# Patient Record
Sex: Male | Born: 1985 | Race: White | Hispanic: No | Marital: Married | Smoking: Never smoker
Health system: Southern US, Community
[De-identification: ages and names within clinical notes are randomized; demographics above are authoritative.]

## PROBLEM LIST (undated history)

## (undated) DIAGNOSIS — L409 Psoriasis, unspecified: Secondary | ICD-10-CM

## (undated) DIAGNOSIS — L0390 Cellulitis, unspecified: Secondary | ICD-10-CM

## (undated) DIAGNOSIS — D229 Melanocytic nevi, unspecified: Secondary | ICD-10-CM

## (undated) DIAGNOSIS — R12 Heartburn: Secondary | ICD-10-CM

## (undated) HISTORY — DX: Psoriasis, unspecified: L40.9

## (undated) HISTORY — DX: Melanocytic nevi, unspecified: D22.9

## (undated) HISTORY — DX: Heartburn: R12

---
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
## 2012-12-24 ENCOUNTER — Encounter (HOSPITAL_COMMUNITY): Payer: Self-pay | Admitting: Emergency Medicine

## 2012-12-24 ENCOUNTER — Emergency Department (HOSPITAL_COMMUNITY)
Admission: EM | Admit: 2012-12-24 | Discharge: 2012-12-24 | Disposition: A | Discharge status: Home / Self Care | Payer: BC Managed Care – PPO | Attending: Emergency Medicine | Admitting: Emergency Medicine

## 2012-12-24 DIAGNOSIS — Y939 Activity, unspecified: Secondary | ICD-10-CM | POA: Insufficient documentation

## 2012-12-24 DIAGNOSIS — Y929 Unspecified place or not applicable: Secondary | ICD-10-CM | POA: Insufficient documentation

## 2012-12-24 DIAGNOSIS — Z91030 Bee allergy status: Secondary | ICD-10-CM

## 2012-12-24 DIAGNOSIS — T63461A Toxic effect of venom of wasps, accidental (unintentional), initial encounter: Secondary | ICD-10-CM | POA: Insufficient documentation

## 2012-12-24 DIAGNOSIS — Z872 Personal history of diseases of the skin and subcutaneous tissue: Secondary | ICD-10-CM | POA: Insufficient documentation

## 2012-12-24 DIAGNOSIS — T6391XA Toxic effect of contact with unspecified venomous animal, accidental (unintentional), initial encounter: Secondary | ICD-10-CM | POA: Insufficient documentation

## 2012-12-24 HISTORY — DX: Cellulitis, unspecified: L03.90

## 2012-12-24 MED ORDER — PREDNISONE 20 MG PO TABS
60.0000 mg | ORAL_TABLET | Freq: Once | ORAL | Status: AC
  Administered 2012-12-24: 60 mg via ORAL
  Filled 2012-12-24 (×2): qty 3

## 2012-12-24 MED ORDER — PREDNISONE 20 MG PO TABS: 40.0000 mg | ORAL_TABLET | Freq: Every day | ORAL | Status: DC

## 2012-12-24 NOTE — ED Notes (Signed)
Pt states that he was stung almost 48 hrs ago by what he thinks were red wasps. Was having no problems but is now having swelling, redness, and pain in R ankle where he was stung.

## 2012-12-24 NOTE — ED Provider Notes (Signed)
CSN: [id number]     Arrival date & time 12/24/12  2204 History    This chart was scribed for Chad Schmidt, non-physician practitioner working with Chad Anger, DO by Leone Payor, ED Scribe. This patient was seen in room WTR7/WTR7 and the patient's care was started at 2204.   First MD Initiated Contact with Patient 12/24/12 2216     Chief Complaint  Patient presents with  . Insect Bite    The history is provided by the patient. No language interpreter was used.    HPI Comments: Chad Schmidt is a 27 y.o. male who presents to the Emergency Department complaining of a wasp sting to the right ankle that occurred about 48 hours ago. States he has taken benadryl, aspirin, advil and applied ice without relief. He is continuing to have constant, unchanged swelling, redness, and pain. He denies a past medical history. Denies allergies to bees.      Past Medical History  Diagnosis Date  . Cellulitis    No past surgical history on file. No family history on file. History  Substance Use Topics  . Smoking status: Never Smoker   . Smokeless tobacco: Not on file  . Alcohol Use: 4.2 oz/week    7 Cans of beer per week    Review of Systems  Skin:       Wasp sting  All other systems reviewed and are negative.    Allergies  Review of patient's allergies indicates no known allergies.  Home Medications   Current Outpatient Rx  Name  Route  Sig  Dispense  Refill  . aspirin 325 MG tablet   Oral   Take 650 mg by mouth every 6 (six) hours as needed for pain.         . diphenhydrAMINE (BENADRYL) 25 MG tablet   Oral   Take 25 mg by mouth every 6 (six) hours as needed for itching.         Marland Kitchen ibuprofen (ADVIL,MOTRIN) 200 MG tablet   Oral   Take 600 mg by mouth every 6 (six) hours as needed for pain.          BP 128/85  Pulse 80  Temp(Src) 98.4 F (36.9 C) (Oral)  SpO2 99% Physical Exam  Nursing note and vitals reviewed. Constitutional: He is oriented to person,  place, and time. He appears well-developed and well-nourished.  HENT:  Head: Normocephalic and atraumatic.  Eyes: Conjunctivae and EOM are normal. Pupils are equal, round, and reactive to light.  Neck: Normal range of motion. Neck supple.  Cardiovascular: Normal rate, regular rhythm, normal heart sounds and intact distal pulses.  Exam reveals no gallop and no friction rub.   No murmur heard. Intact distal pulses with brisk cap refill.   Pulmonary/Chest: Effort normal and breath sounds normal.  Abdominal: Soft. Bowel sounds are normal.  Musculoskeletal: Normal range of motion.  Right ankle is mildly swollen. Mildly painful with ROM but pt is able to weight bear.   Neurological: He is alert and oriented to person, place, and time.  Skin: Skin is warm and dry.  Mildly inflamed but no evidence of cellulitis or discharge. No abscesses.    Psychiatric: He has a normal mood and affect.    ED Course   Procedures (including critical care time)  DIAGNOSTIC STUDIES: Oxygen Saturation is 99% on RA, normal by my interpretation.    COORDINATION OF CARE: 11:06 PM Discussed treatment plan with pt at bedside and pt agreed to  plan.   Labs Reviewed - No data to display No results found. 1. Bee sting allergy     MDM  Patient with bee sting, approximately 2 days ago, reporting ongoing swelling of right ankle. No evidence of infection. Will discharge home with prednisone. Recommend followup with primary care provider.  I personally performed the services described in this documentation, which was scribed in my presence. The recorded information has been reviewed and is accurate.    Chad Horseman, PA-C 12/25/12 0022

## 2012-12-26 NOTE — ED Provider Notes (Signed)
Medical screening examination/treatment/procedure(s) were performed by non-physician practitioner and as supervising physician I was immediately available for consultation/collaboration.   Laray Anger, DO 12/26/12 1752

## 2015-10-02 DIAGNOSIS — M549 Dorsalgia, unspecified: Secondary | ICD-10-CM | POA: Insufficient documentation

## 2015-10-02 DIAGNOSIS — J45909 Unspecified asthma, uncomplicated: Secondary | ICD-10-CM | POA: Insufficient documentation

## 2015-10-02 DIAGNOSIS — L409 Psoriasis, unspecified: Secondary | ICD-10-CM | POA: Insufficient documentation

## 2015-10-02 DIAGNOSIS — A6000 Herpesviral infection of urogenital system, unspecified: Secondary | ICD-10-CM | POA: Insufficient documentation

## 2015-10-02 DIAGNOSIS — Z8659 Personal history of other mental and behavioral disorders: Secondary | ICD-10-CM | POA: Insufficient documentation

## 2016-01-27 ENCOUNTER — Other Ambulatory Visit: Payer: Self-pay | Admitting: Gastroenterology

## 2016-01-27 DIAGNOSIS — R7989 Other specified abnormal findings of blood chemistry: Secondary | ICD-10-CM

## 2016-01-27 DIAGNOSIS — R945 Abnormal results of liver function studies: Principal | ICD-10-CM

## 2016-01-27 LAB — CHG HEINZ BODIES; DIRECT: ANA Direct: NEGATIVE

## 2016-01-27 LAB — IRON,TIBC AND FERRITIN PANEL
%SAT: 20
Ferritin: 203.6
Iron: 82
TIBC: 405

## 2016-01-27 LAB — HEPATITIS B SURFACE ANTIGEN
HBsAg Screen: NEGATIVE
HCV Ab: <0.1
Hep B Core Ab, Tot: NEGATIVE

## 2016-02-03 LAB — HM COLONOSCOPY

## 2016-02-05 ENCOUNTER — Other Ambulatory Visit: Payer: Self-pay

## 2016-02-12 ENCOUNTER — Ambulatory Visit
Admission: RE | Admit: 2016-02-12 | Discharge: 2016-02-12 | Disposition: A | Discharge status: Home / Self Care | Payer: 59 | Source: Ambulatory Visit | Attending: Gastroenterology | Admitting: Gastroenterology

## 2016-02-12 DIAGNOSIS — R7989 Other specified abnormal findings of blood chemistry: Secondary | ICD-10-CM

## 2016-02-12 DIAGNOSIS — R945 Abnormal results of liver function studies: Principal | ICD-10-CM

## 2016-02-17 ENCOUNTER — Other Ambulatory Visit: Payer: Self-pay | Admitting: Gastroenterology

## 2016-02-17 DIAGNOSIS — K8020 Calculus of gallbladder without cholecystitis without obstruction: Secondary | ICD-10-CM

## 2016-02-21 ENCOUNTER — Ambulatory Visit
Admission: RE | Admit: 2016-02-21 | Discharge: 2016-02-21 | Disposition: A | Discharge status: Home / Self Care | Payer: 59 | Source: Ambulatory Visit | Attending: Gastroenterology | Admitting: Gastroenterology

## 2016-02-21 DIAGNOSIS — K8020 Calculus of gallbladder without cholecystitis without obstruction: Secondary | ICD-10-CM

## 2016-02-21 MED ORDER — GADOBENATE DIMEGLUMINE 529 MG/ML IV SOLN: 17.0000 mL | Freq: Once | INTRAVENOUS | Status: DC | PRN

## 2016-06-03 LAB — BOWEL DISORDERS CASCADE: Tissue Transglutaminase Ab, IgA: <2

## 2016-06-03 LAB — PROTHROMBIN TIME: Prothrombin Time: 10.2

## 2016-06-03 LAB — ENDOMYSIAL IGA ANTIBODY: Endomysial Ab, IgA: NEGATIVE

## 2016-06-03 LAB — TSH: TSH: 1.52 (ref <=5.90)

## 2016-06-03 LAB — IGE: Immunoglobulin E, Total: 123

## 2016-07-14 ENCOUNTER — Telehealth: Payer: 59 | Admitting: Nurse Practitioner

## 2016-07-14 DIAGNOSIS — H6691 Otitis media, unspecified, right ear: Secondary | ICD-10-CM

## 2016-07-14 MED ORDER — CEFDINIR 300 MG PO CAPS: 300.0000 mg | ORAL_CAPSULE | Freq: Two times a day (BID) | ORAL | 0 refills | Status: AC

## 2016-07-14 MED ORDER — FLUTICASONE PROPIONATE 50 MCG/ACT NA SUSP: 2.0000 | Freq: Every day | NASAL | 0 refills | Status: DC

## 2016-07-14 NOTE — Progress Notes (Signed)
We are sorry that you are not feeling well. Here is how we plan to help!   Based on what you have shared with me it looks like you have sinusitis and right otitis media (ear infection) which have impacted your sinuses.  There is not specific treatment for viral sinusitis other than to help you with the symptoms until the infection runs its course. You may use an oral decongestant such as Mucinex D or if you have glaucoma or high blood pressure use plain Mucinex. Saline nasal spray help and can safely be used as often as needed for congestion, I have prescribed: Fluticasone nasal spray two sprays in each nostril twice a day.    For your ear infection, I am prescribing Cefdinir 300mg  twice daily for 10 days. For your ear pain, fever or general discomfort, you may use over-the-counter Ibuprofen or Tylenol as directed.    Sinus infections and ear infections are not as easily transmitted as other respiratory infection, however we still recommend that you avoid close contact with loved ones, especially the very young and elderly. Remember to wash your hands thoroughly throughout the day as this is the number one way to prevent the spread of infection!   Home Care:  Only take medications as instructed by your medical team.  Complete the entire course of an antibiotic.  Do not take these medications with alcohol.  A steam or ultrasonic humidifier can help congestion. You can place a towel over your head and breathe in the steam from hot water coming from a faucet.  Avoid close contacts especially the very young and the elderly.  Cover your mouth when you cough or sneeze.  Always remember to wash your hands.   Get Help Right Away If:  You develop worsening fever or sinus pain.  You develop a severe head ache or visual changes.  Your symptoms persist after you have completed your treatment plan.  If your ear pain does not improve within 48 to 72 hours, hearing loss, or symptoms worsen, follow up  with your primary care physician immediately.    Make sure you  Understand these instructions.  Will watch your condition.  Will get help right away if you are not doing well or get worse.   Your e-visit answers were reviewed by a board certified advanced clinical practitioner to complete your personal care plan. Depending on the condition, your plan could have included both over the counter or prescription medications.   If there is a problem please reply once you have received a response from your provider.   Your safety is important to [place]. If you have drug allergies check your prescription carefully.    You can use MyChart to ask questions about today's visit, request a non-urgent call back, or ask for a work or school excuse for 24 hours related to this e-Visit. If it has been greater than 24 hours you will need to follow up with your provider, or enter a new e-Visit to address those concerns.   You will get an e-mail in the next two days asking about your experience. I hope that your e-visit has been valuable and will speed your recovery. Thank you for using e-visits.

## 2016-07-15 ENCOUNTER — Telehealth: Payer: 59 | Admitting: Family

## 2016-07-15 DIAGNOSIS — H109 Unspecified conjunctivitis: Secondary | ICD-10-CM

## 2016-07-15 MED ORDER — POLYMYXIN B-TRIMETHOPRIM 10000-0.1 UNIT/ML-% OP SOLN: 1.0000 [drp] | OPHTHALMIC | 0 refills | Status: DC

## 2016-07-15 NOTE — Progress Notes (Signed)

## 2017-10-06 DIAGNOSIS — H66002 Acute suppurative otitis media without spontaneous rupture of ear drum, left ear: Secondary | ICD-10-CM | POA: Diagnosis not present

## 2017-10-06 DIAGNOSIS — R5383 Other fatigue: Secondary | ICD-10-CM | POA: Diagnosis not present

## 2017-10-06 DIAGNOSIS — J111 Influenza due to unidentified influenza virus with other respiratory manifestations: Secondary | ICD-10-CM | POA: Diagnosis not present

## 2017-10-06 DIAGNOSIS — J0140 Acute pansinusitis, unspecified: Secondary | ICD-10-CM | POA: Diagnosis not present

## 2017-10-19 DIAGNOSIS — R945 Abnormal results of liver function studies: Secondary | ICD-10-CM | POA: Diagnosis not present

## 2018-04-16 DIAGNOSIS — L409 Psoriasis, unspecified: Secondary | ICD-10-CM | POA: Diagnosis not present

## 2018-04-16 DIAGNOSIS — D229 Melanocytic nevi, unspecified: Secondary | ICD-10-CM | POA: Diagnosis not present

## 2018-04-23 DIAGNOSIS — R509 Fever, unspecified: Secondary | ICD-10-CM | POA: Diagnosis not present

## 2018-04-23 DIAGNOSIS — R5383 Other fatigue: Secondary | ICD-10-CM | POA: Diagnosis not present

## 2018-04-23 DIAGNOSIS — J00 Acute nasopharyngitis [common cold]: Secondary | ICD-10-CM | POA: Diagnosis not present

## 2018-06-05 DIAGNOSIS — D485 Neoplasm of uncertain behavior of skin: Secondary | ICD-10-CM | POA: Diagnosis not present

## 2018-07-30 DIAGNOSIS — J069 Acute upper respiratory infection, unspecified: Secondary | ICD-10-CM | POA: Diagnosis not present

## 2018-11-14 DIAGNOSIS — R740 Nonspecific elevation of levels of transaminase and lactic acid dehydrogenase [LDH]: Secondary | ICD-10-CM | POA: Diagnosis not present

## 2018-11-14 LAB — HEPATIC FUNCTION PANEL: Bilirubin, Total: 0.7

## 2019-01-17 NOTE — Progress Notes (Signed)
Chad Schmidt is a 33 y.o. male here to Establish Care.  I acted as a Education administrator for [employer], PA-C Anselmo Pickler, LPN  History of Present Illness:   Chief Complaint  Patient presents with  . Establish Care  . Heartburn  . Mass    Right axilla x 1 week, painful to touch    Heartburn Pt c/o heartburn several years, but has worsened in the past 6 months. He is taking Tums with some relief. He has never take any regular PPI and H2 antagonist. Denies triggers. Has had weight gain over the past few years.  Denies rectal bleeding, increase in alcohol intake.  He does see GI regularly but this is for elevated LFTs.  Lump Pt c/o lump right axilla x 1 week, painful to touch.  He found this when he was looking at something on his armpit, sometimes has eczema in this area.  Did recently have some mosquito bites on that arm.  Denies fever, chills, unintentional weight loss, other swollen lymph nodes.  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  Responds well to oral Valtrex.  Needs refill today.  Fatigue Patient reports that he has had intermittent fatigue over the past year.  He does work full-time which can be up to 50 to 55 hours a week.  He has 2 small children.  He has not been able to run his mitral do cardio because of hip pain, but he does do strength training.  Denies any decrease in sex drive or increase in anxiety.  He is curious about his testosterone level.  He does have a history of possible ADHD, but whenever he took stimulants he did not like the way it made him feel.  He does not drink excessive caffeine.  He gets about 6 to 7 hours of sleep at night.  Eats very clean per his report.  Does feel like he drinks enough water.  Right hip pain Has had ongoing issues with his right hip for several months.  It is now to the point where it is causing him to have issues with running.  He gets pain on the side of his hip that he feels is radiating down to his  posterior thigh.  He denies any numbness or tingling in the area.  He does report that he has had MRI of his lower back L5-S1 area and was told he may have a slight bulge in his disc.  MRI was done about 7 to 8 years ago by chiropractor.  He has not really seen a regular chiropractor since. Does feel like pain is worse after sitting for long periods of time -- drives to [facility] weekly and experiences this.  Weight -- Weight: 203 lb (92.1 kg)   Depression screen [facility] 2/9 01/18/2019  Decreased Interest 0  Down, Depressed, Hopeless 0  PHQ - 2 Score 0    No flowsheet data found.   Other providers/specialists: Patient Care Team: Inda Coke, [place] as PCP - General (Physician Assistant) Otis Brace, MD as Consulting Physician (Gastroenterology) Lavonna Monarch, MD as Consulting Physician (Dermatology)   Past Medical History:  Diagnosis Date  . Heartburn   . Psoriasis      Social History   Socioeconomic History  . Marital status: Married    Spouse name: [place]  . Number of children: Not on file  . Years of education: Not on file  . Highest education level: Not on file  Occupational History  . Not on  file  Social Needs  . Financial resource strain: Not on file  . Food insecurity    Worry: Not on file    Inability: Not on file  . Transportation needs    Medical: Not on file    Non-medical: Not on file  Tobacco Use  . Smoking status: Never Smoker  . Smokeless tobacco: Never Used  Substance and Sexual Activity  . Alcohol use: Yes    Alcohol/week: 14.0 standard drinks    Types: 14 Cans of beer per week  . Drug use: No  . Sexual activity: Yes  Lifestyle  . Physical activity    Days per week: Not on file    Minutes per session: Not on file  . Stress: Not on file  Relationships  . Social Herbalist on phone: Not on file    Gets together: Not on file    Attends religious service: Not on file    Active member of club or organization: Not on file    Attends  meetings of clubs or organizations: Not on file    Relationship status: Not on file  . Intimate partner violence    Fear of current or ex partner: Not on file    Emotionally abused: Not on file    Physically abused: Not on file    Forced sexual activity: Not on file  Other Topics Concern  . Not on file  Social History Narrative  . Not on file    Past Surgical History:  Procedure Laterality Date  . WISDOM TOOTH EXTRACTION Bilateral 2004    History reviewed. No pertinent family history.  No Known Allergies   Current Medications:   Current Outpatient Medications:  .  albuterol (VENTOLIN HFA) 108 (90 Base) MCG/ACT inhaler, Inhale into the lungs., Disp: , Rfl:  .  Cholecalciferol (VITAMIN D) 50 MCG (2000 UT) tablet, Take 2,000 Units by mouth daily., Disp: , Rfl:  .  clobetasol ointment (TEMOVATE) 2.26 %, Apply 1 application topically daily. , Disp: , Rfl:  .  ibuprofen (ADVIL,MOTRIN) 200 MG tablet, Take 600 mg by mouth every 6 (six) hours as needed for pain., Disp: , Rfl:  .  Krill Oil 500 MG CAPS, Take 1 capsule by mouth daily., Disp: , Rfl:  .  Omega-3 Fatty Acids (FISH OIL) 1000 MG CAPS, Take 1 capsule by mouth daily., Disp: , Rfl:  .  valACYclovir (VALTREX) 500 MG tablet, Take 1 tablet (500 mg total) by mouth 2 (two) times daily. For Outbreaks x 4 days., Disp: 24 tablet, Rfl: 1   Review of Systems:   ROS Negative unless otherwise specified per HPI.   Vitals:   Vitals:   01/18/19 1116  BP: 120/74  Pulse: 65  Temp: 98.4 F (36.9 C)  TempSrc: Temporal  SpO2: 97%  Weight: 203 lb (92.1 kg)  Height: 6\' 2"  (1.88 m)      Body mass index is 26.06 kg/m.  Physical Exam:   Physical Exam Vitals signs and nursing note reviewed.  Constitutional:      General: He is not in acute distress.    Appearance: He is well-developed. He is not ill-appearing or toxic-appearing.  Cardiovascular:     Rate and Rhythm: Normal rate and regular rhythm.     Pulses: Normal pulses.      Heart sounds: Normal heart sounds, S1 normal and S2 normal.     Comments: No LE edema Pulmonary:     Effort: Pulmonary effort is normal.  Breath sounds: Normal breath sounds.  Musculoskeletal:     Comments: Bilateral HIP Exam: Well aligned, no significant deformity. No overlying skin changes. No focal bony tenderness   Passive Flexion -- moderate pain of R side Passive Internal & External Rotation -- mild pain of R side with internal rotation    Lymphadenopathy:     Upper Body:     Right upper body: Axillary adenopathy (extremely difficult to palpate, very small) present.  Skin:    General: Skin is warm and dry.  Neurological:     Mental Status: He is alert.     GCS: GCS eye subscore is 4. GCS verbal subscore is 5. GCS motor subscore is 6.     Comments: Normal sensation to b/l legs  Psychiatric:        Speech: Speech normal.        Behavior: Behavior normal. Behavior is cooperative.     No results found for this or any previous visit.  Assessment and Plan:   Chad was seen today for establish care, heartburn and mass.  Diagnoses and all orders for this visit:  Fatigue, unspecified type No red flags on exam, however will update labs.  Encouraged continued adequate sleep.  He does snore but he has little concern for sleep apnea, however we did discuss that if fatigue does not improve we should send to neuro for outpatient sleep study.  Further work-up based on lab results.  He does work significant amount of hours and I think that this is contributing to fatigue. -     CBC with Differential/Platelet -     Comprehensive metabolic panel -     TSH  Heartburn Recommended oral over-the-counter Nexium or Prilosec.  Follow-up if no improvement.  Right hip pain Also evaluated by Wendy Poet our athletic trainer.  She recommended referral to Dr. Charlann Boxer for further work-up.  Could consider a chiropractor in the meantime. -     Ambulatory referral to Sports  Medicine  Genital herpes simplex, unspecified site Refill Valtrex.  Lymphadenopathy Will check labs for fatigue at this time.  I did discuss that if lymph node seems to be present after 1 more week we will consider imaging.  Recommend he follow-up if this is the case.  Other orders -     valACYclovir (VALTREX) 500 MG tablet; Take 1 tablet (500 mg total) by mouth 2 (two) times daily. For Outbreaks x 4 days.    . Reviewed expectations re: course of current medical issues. . Discussed self-management of symptoms. . Outlined signs and symptoms indicating need for more acute intervention. . Patient verbalized understanding and all questions were answered. . See orders for this visit as documented in the electronic medical record. . Patient received an After-Visit Summary.  I spent 45 minutes with this patient, greater than 50% was face-to-face time counseling regarding the above diagnoses.  Inda Coke, PA-C

## 2019-01-18 ENCOUNTER — Ambulatory Visit (INDEPENDENT_AMBULATORY_CARE_PROVIDER_SITE_OTHER): Payer: BC Managed Care – PPO | Admitting: Physician Assistant

## 2019-01-18 ENCOUNTER — Other Ambulatory Visit: Payer: Self-pay

## 2019-01-18 ENCOUNTER — Encounter: Payer: Self-pay | Admitting: Physician Assistant

## 2019-01-18 VITALS — BP 120/74 | HR 65 | Temp 98.4°F | Ht 74.0 in | Wt 203.0 lb

## 2019-01-18 DIAGNOSIS — R591 Generalized enlarged lymph nodes: Secondary | ICD-10-CM

## 2019-01-18 DIAGNOSIS — R12 Heartburn: Secondary | ICD-10-CM | POA: Insufficient documentation

## 2019-01-18 DIAGNOSIS — R5383 Other fatigue: Secondary | ICD-10-CM | POA: Diagnosis not present

## 2019-01-18 DIAGNOSIS — A6000 Herpesviral infection of urogenital system, unspecified: Secondary | ICD-10-CM

## 2019-01-18 DIAGNOSIS — M25551 Pain in right hip: Secondary | ICD-10-CM | POA: Diagnosis not present

## 2019-01-18 DIAGNOSIS — R7989 Other specified abnormal findings of blood chemistry: Secondary | ICD-10-CM | POA: Insufficient documentation

## 2019-01-18 LAB — COMPREHENSIVE METABOLIC PANEL
ALT: 66 U/L — ABNORMAL HIGH (ref 0–53)
AST: 28 U/L (ref 0–37)
Albumin: 5.3 g/dL — ABNORMAL HIGH (ref 3.5–5.2)
Alkaline Phosphatase: 73 U/L (ref 39–117)
BUN: 16 mg/dL (ref 6–23)
CO2: 27 mEq/L (ref 19–32)
Calcium: 10 mg/dL (ref 8.4–10.5)
Chloride: 103 mEq/L (ref 96–112)
Creatinine, Ser: 0.89 mg/dL (ref 0.40–1.50)
GFR: 97.08 mL/min (ref >60.00)
Glucose, Bld: 100 mg/dL — ABNORMAL HIGH (ref 70–99)
Potassium: 4.2 mEq/L (ref 3.5–5.1)
Sodium: 138 mEq/L (ref 135–145)
Total Bilirubin: 0.5 mg/dL (ref 0.2–1.2)
Total Protein: 8 g/dL (ref 6.0–8.3)

## 2019-01-18 LAB — CBC WITH DIFFERENTIAL/PLATELET
Basophils Absolute: 0 10*3/uL (ref 0.0–0.1)
Basophils Relative: 0.7 % (ref 0.0–3.0)
Eosinophils Absolute: 0.1 10*3/uL (ref 0.0–0.7)
Eosinophils Relative: 1.7 % (ref 0.0–5.0)
HCT: 42 % (ref 39.0–52.0)
Hemoglobin: 14.6 g/dL (ref 13.0–17.0)
Lymphocytes Relative: 28.2 % (ref 12.0–46.0)
Lymphs Abs: 1.5 10*3/uL (ref 0.7–4.0)
MCHC: 34.8 g/dL (ref 30.0–36.0)
MCV: 90.6 fl (ref 78.0–100.0)
Monocytes Absolute: 0.5 10*3/uL (ref 0.1–1.0)
Monocytes Relative: 8.8 % (ref 3.0–12.0)
Neutro Abs: 3.2 10*3/uL (ref 1.4–7.7)
Neutrophils Relative %: 60.6 % (ref 43.0–77.0)
Platelets: 260 10*3/uL (ref 150.0–400.0)
RBC: 4.64 Mil/uL (ref 4.22–5.81)
RDW: 11.8 % (ref 11.5–15.5)
WBC: 5.3 10*3/uL (ref 4.0–10.5)

## 2019-01-18 LAB — TSH: TSH: 1.81 u[IU]/mL (ref 0.35–4.50)

## 2019-01-18 MED ORDER — VALACYCLOVIR HCL 500 MG PO TABS: 500.0000 mg | ORAL_TABLET | Freq: Two times a day (BID) | ORAL | 1 refills | Status: DC

## 2019-01-18 NOTE — Patient Instructions (Signed)
It was great to see you!  1. You will be contacted about your referral to Dr. Charlann Boxer. 2. Consider seeing chiropractor as we discussed. 3. We will contact you with your lab results. 4. Prilosec or Nexium for heartburn 5. If lymph node is still there in one week, let me know and we can get imaging.  Take care,  Inda Coke PA-C

## 2019-01-29 ENCOUNTER — Encounter: Payer: BC Managed Care – PPO | Admitting: Physician Assistant

## 2019-01-30 ENCOUNTER — Encounter: Payer: Self-pay | Admitting: Physician Assistant

## 2019-02-06 ENCOUNTER — Telehealth: Payer: Self-pay | Admitting: Physical Therapy

## 2019-02-06 ENCOUNTER — Ambulatory Visit (INDEPENDENT_AMBULATORY_CARE_PROVIDER_SITE_OTHER): Payer: BC Managed Care – PPO | Admitting: Physician Assistant

## 2019-02-06 ENCOUNTER — Other Ambulatory Visit: Payer: Self-pay

## 2019-02-06 ENCOUNTER — Encounter: Payer: Self-pay | Admitting: Physician Assistant

## 2019-02-06 VITALS — Temp 98.7°F | Ht 74.0 in | Wt 205.0 lb

## 2019-02-06 DIAGNOSIS — M25521 Pain in right elbow: Secondary | ICD-10-CM

## 2019-02-06 MED ORDER — AMOXICILLIN 875 MG PO TABS
875.0000 mg | ORAL_TABLET | Freq: Two times a day (BID) | ORAL | 0 refills | Status: DC
Start: 2019-02-06 — End: 2019-07-09

## 2019-02-06 MED ORDER — DOXYCYCLINE HYCLATE 100 MG PO TABS: 100.0000 mg | ORAL_TABLET | Freq: Two times a day (BID) | ORAL | 0 refills | Status: DC

## 2019-02-06 NOTE — Telephone Encounter (Signed)
Colletta Maryland, can you please look into this. He was suppose to have an Urgent referral to Dr. Junius Roads per Aldona Bar.

## 2019-02-06 NOTE — Telephone Encounter (Signed)
They called him to schedule and he refused.  He told them he already had an appt with Dr Tamala Julian.

## 2019-02-06 NOTE — Progress Notes (Signed)
Virtual Visit via Video   I connected with Chad Schmidt on 02/06/19 at  7:40 AM EDT by a video enabled telemedicine application and verified that I am speaking with the correct person using two identifiers. Location patient: Home Location provider: [facility], Office Persons participating in the virtual visit: Chad Schmidt, [place] PA-C.  I discussed the limitations of evaluation and management by telemedicine and the availability of in person appointments. The patient expressed understanding and agreed to proceed.  I acted as a Education administrator for [employer], PA-C [employer], LPN  Subjective:   HPI:   Rt elbow pain Pt c/o Rt elbow pain and swelling since Saturday, warm to touch. He said one week prior had some pimples on his elbow. He went tubing this weekend. Pt is using Ibuprofen and ice to elbow. Hx cellulitis in same arm 17 yrs ago. He also has history of bursitis in his knee and he states that the pain and symptoms are very similar to that. He feels as thought the redness is spreading down his arm. Denies: fevers, chills, n/v, diaphoresis.  ROS: See pertinent positives and negatives per HPI.  Patient Active Problem List   Diagnosis Date Noted  . Heartburn   . Elevated LFTs   . Psoriasis 10/02/2015  . 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 History of ADHD 10/02/2015  . Asthma 10/02/2015  . Back pain 10/02/2015    Social History   Tobacco Use  . Smoking status: Never Smoker  . Smokeless tobacco: Never Used  Substance Use Topics  . Alcohol use: Yes    Alcohol/week: 14.0 standard drinks    Types: 14 Cans of beer per week    Current Outpatient Medications:  .  albuterol (VENTOLIN HFA) 108 (90 Base) MCG/ACT inhaler, Inhale into the lungs., Disp: , Rfl:  .  Cholecalciferol (VITAMIN D) 50 MCG (2000 UT) tablet, Take 2,000 Units by mouth daily., Disp: , Rfl:  .  clobetasol ointment (TEMOVATE) [id number] %, Apply 1 application topically daily. , Disp: , Rfl:  .   ibuprofen (ADVIL,MOTRIN) 200 MG tablet, Take 600 mg by mouth every 6 (six) hours as needed for pain., Disp: , Rfl:  .  Krill Oil 500 MG CAPS, Take 1 capsule by mouth daily., Disp: , Rfl:  .  Omega-3 Fatty Acids (FISH OIL) 1000 MG CAPS, Take 1 capsule by mouth daily., Disp: , Rfl:  .  valACYclovir (VALTREX) 500 MG tablet, Take 1 tablet (500 mg total) by mouth 2 (two) times daily. For Outbreaks x 4 days., Disp: 24 tablet, Rfl: 1 .  amoxicillin (AMOXIL) 875 MG tablet, Take 1 tablet (875 mg total) by mouth 2 (two) times daily., Disp: 20 tablet, Rfl: 0 .  doxycycline (VIBRA-TABS) 100 MG tablet, Take 1 tablet (100 mg total) by mouth 2 (two) times daily., Disp: 20 tablet, Rfl: 0  No Known Allergies  Objective:   VITALS: Per patient if applicable, see vitals. GENERAL: Alert, appears well and in no acute distress. HEENT: Atraumatic, conjunctiva clear, no obvious abnormalities on inspection of external nose and ears. NECK: Normal movements of the head and neck. CARDIOPULMONARY: No increased WOB. Speaking in clear sentences. I:E ratio WNL.  MS: Moves all visible extremities without noticeable abnormality. PSYCH: Pleasant and cooperative, well-groomed. Speech normal rate and rhythm. Affect is appropriate. Insight and judgement are appropriate. Attention is focused, linear, and appropriate.  NEURO: CN grossly intact. Oriented as arrived to appointment on time with no prompting. Moves both UE equally.  SKIN:  Difficult to assess via video, however R elbow appears swollen and erythematous  Assessment and Plan:   Chad was seen today for elbow pain.  Diagnoses and all orders for this visit:  Right elbow pain -     Ambulatory referral to Orthopedics  Other orders -     doxycycline (VIBRA-TABS) 100 MG tablet; Take 1 tablet (100 mg total) by mouth 2 (two) times daily. -     amoxicillin (AMOXIL) 875 MG tablet; Take 1 tablet (875 mg total) by mouth 2 (two) times daily.   Possible skin infection from  recent ingrown hair follicle and/or tubing. Cannot r/o bursitis. Due to his history and worsening symptoms, I do think that he would benefit from in office evaluation of elbow. Because of history of bursitis, will go ahead and refer to Dr. Eunice Blase for evaluation of elbow for more thorough physical exam.  Worsening precautions advised.  . Reviewed expectations re: course of current medical issues. . Discussed self-management of symptoms. . Outlined signs and symptoms indicating need for more acute intervention. . Patient verbalized understanding and all questions were answered. Marland Kitchen Health Maintenance issues including appropriate healthy diet, exercise, and smoking avoidance were discussed with patient. . See orders for this visit as documented in the electronic medical record.  I discussed the assessment and treatment plan with the patient. The patient was provided an opportunity to ask questions and all were answered. The patient agreed with the plan and demonstrated an understanding of the instructions.   The patient was advised to call back or seek an in-person evaluation if the symptoms worsen or if the condition fails to improve as anticipated.   CMA or LPN served as scribe during this visit. History, Physical, and Plan performed by medical provider. The above documentation has been reviewed and is accurate and complete.   [place], [place] 02/06/2019

## 2019-02-06 NOTE — Telephone Encounter (Signed)
Copied from [place] [phone]. Topic: General - Inquiry >> Feb 06, 2019 12:42 PM Alanda Slim E wrote: Reason for CRM: Pt called to inquire about the fluid test for the fluid in his elbow. He stated he was told about having this done today and hasnt heard anything from [facility] and wanted to check on this./ please advise

## 2019-02-06 NOTE — Telephone Encounter (Signed)
Discussed with Colletta Maryland and told her appt with Dr. Tamala Julian is for his hip from last visit. He is suppose to see Dr. Junius Roads for his elbow. Colletta Maryland said she would call pt again and have him call there office to schedule an appointment.

## 2019-02-07 ENCOUNTER — Ambulatory Visit (INDEPENDENT_AMBULATORY_CARE_PROVIDER_SITE_OTHER): Payer: BC Managed Care – PPO | Admitting: Family Medicine

## 2019-02-07 ENCOUNTER — Encounter: Payer: Self-pay | Admitting: Family Medicine

## 2019-02-07 DIAGNOSIS — M7021 Olecranon bursitis, right elbow: Secondary | ICD-10-CM | POA: Diagnosis not present

## 2019-02-07 NOTE — Progress Notes (Signed)
   Office Visit Note   Patient: Chad Schmidt           Date of Birth: Sep 24, 1985           MRN: [id number] Visit Date: 02/07/2019 Requested by: Inda Coke, [place],  [place] [address] PCP: Inda Coke, [place]  Subjective: Chief Complaint  Patient presents with  . Right Elbow - Pain    Pain with redness/swelling - started 02/02/19 and worsened. NKI. Started on the 2 prescribed abx from PCP yesterday - improving already. Swelling down. Still has soreness/redness in the elbow.    HPI: Elbow pain.  Symptoms started September 5, he noticed some noticed [place]  Prior to that he had a erythematous pimple or something similar which seem to resolve on its own.  Over the past few days his elbow became acutely more swollen and painful.  He went to his PCP yesterday and was given amoxicillin and doxycycline and today it is substantially better.  He has a history of cellulitis when he was a teenager requiring antibiotic therapy.  He is never had gout, no family history of that.  He is otherwise in excellent health health.  Denies any numbness or tingling in his arm.              ROS: No fevers or chills.  All other systems were reviewed and are negative.  Objective: Vital Signs: There were no vitals taken for this visit.  Physical Exam:  General:  Alert and oriented, in no acute distress. Pulm:  Breathing unlabored. Psy:  Normal mood, congruent affect. Skin: There is erythema and slight increased warmth over the olecranon.  No induration, but bursa is only slightly swollen.  He has forward he has full range of motion of the elbow.   Imaging: None  Assessment & Plan: 1.  Right elbow cellulitis/olecranon bursitis, clinically improving -He will complete his course of antibiotics.  He wants to try only doxycycline to minimize his antibiotic exposure.  If the redness does not completely resolve he will contact me for a  refill.  If he gets worse he will come back in for re-check and x-rays.     Procedures: No procedures performed  No notes on file     PMFS History: Patient Active Problem List   Diagnosis Date Noted  . Heartburn   . Elevated LFTs   . Psoriasis 10/02/2015  . 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 History of ADHD 10/02/2015  . Asthma 10/02/2015  . Back pain 10/02/2015   Past Medical History:  Diagnosis Date  . Heartburn   . Psoriasis     History reviewed. No pertinent family history.  Past Surgical History:  Procedure Laterality Date  . WISDOM TOOTH EXTRACTION Bilateral 2004   Social History   Occupational History  . Not on file  Tobacco Use  . Smoking status: Never Smoker  . Smokeless tobacco: Never Used  Substance and Sexual Activity  . Alcohol use: Yes    Alcohol/week: 14.0 standard drinks    Types: 14 Cans of beer per week  . Drug use: No  . Sexual activity: Yes

## 2019-02-08 ENCOUNTER — Encounter: Payer: Self-pay | Admitting: Physician Assistant

## 2019-02-11 ENCOUNTER — Ambulatory Visit: Payer: BC Managed Care – PPO | Admitting: Family Medicine

## 2019-03-01 ENCOUNTER — Other Ambulatory Visit: Payer: Self-pay

## 2019-03-01 DIAGNOSIS — Z20822 Contact with and (suspected) exposure to covid-19: Secondary | ICD-10-CM

## 2019-03-01 DIAGNOSIS — Z20828 Contact with and (suspected) exposure to other viral communicable diseases: Secondary | ICD-10-CM | POA: Diagnosis not present

## 2019-03-02 LAB — NOVEL CORONAVIRUS, NAA: SARS-CoV-2, NAA: NOT DETECTED

## 2019-06-12 DIAGNOSIS — R509 Fever, unspecified: Secondary | ICD-10-CM | POA: Diagnosis not present

## 2019-06-12 DIAGNOSIS — Z20822 Contact with and (suspected) exposure to covid-19: Secondary | ICD-10-CM | POA: Diagnosis not present

## 2019-06-12 DIAGNOSIS — R0602 Shortness of breath: Secondary | ICD-10-CM | POA: Diagnosis not present

## 2019-06-12 DIAGNOSIS — R05 Cough: Secondary | ICD-10-CM | POA: Diagnosis not present

## 2019-06-12 DIAGNOSIS — J069 Acute upper respiratory infection, unspecified: Secondary | ICD-10-CM | POA: Diagnosis not present

## 2019-06-12 DIAGNOSIS — R519 Headache, unspecified: Secondary | ICD-10-CM | POA: Diagnosis not present

## 2019-06-13 ENCOUNTER — Ambulatory Visit: Payer: BC Managed Care – PPO | Attending: Internal Medicine

## 2019-06-13 DIAGNOSIS — Z20822 Contact with and (suspected) exposure to covid-19: Secondary | ICD-10-CM

## 2019-06-15 LAB — NOVEL CORONAVIRUS, NAA: SARS-CoV-2, NAA: DETECTED — AB

## 2019-06-18 ENCOUNTER — Ambulatory Visit: Payer: BC Managed Care – PPO | Attending: Internal Medicine

## 2019-06-18 DIAGNOSIS — Z20822 Contact with and (suspected) exposure to covid-19: Secondary | ICD-10-CM | POA: Diagnosis not present

## 2019-06-19 LAB — NOVEL CORONAVIRUS, NAA: SARS-CoV-2, NAA: DETECTED — AB

## 2019-06-20 ENCOUNTER — Other Ambulatory Visit: Payer: BC Managed Care – PPO

## 2019-07-09 ENCOUNTER — Other Ambulatory Visit: Payer: Self-pay

## 2019-07-09 ENCOUNTER — Encounter: Payer: Self-pay | Admitting: Physician Assistant

## 2019-07-09 ENCOUNTER — Ambulatory Visit (INDEPENDENT_AMBULATORY_CARE_PROVIDER_SITE_OTHER): Payer: BC Managed Care – PPO | Admitting: Physician Assistant

## 2019-07-09 VITALS — BP 110/78 | HR 76 | Temp 98.1°F | Ht 74.0 in | Wt 202.5 lb

## 2019-07-09 DIAGNOSIS — R21 Rash and other nonspecific skin eruption: Secondary | ICD-10-CM | POA: Diagnosis not present

## 2019-07-09 MED ORDER — PREDNISONE 10 MG PO TABS
ORAL_TABLET | ORAL | 0 refills | Status: DC
Start: 2019-07-09 — End: 2019-12-03

## 2019-07-09 MED ORDER — METHYLPREDNISOLONE ACETATE 40 MG/ML IJ SUSP
40.0000 mg | Freq: Once | INTRAMUSCULAR | Status: AC
  Administered 2019-07-09: 13:00:00 40 mg via INTRAMUSCULAR

## 2019-07-09 NOTE — Patient Instructions (Addendum)
It was great to see you!   Start oral prednisone TOMORROW   Start 24-hour allergy pill of your choice today; benadryl as needed on top of that for breakthrough itching   Start over the counter antacid medication such as pepcid, prilosec, nexium, etc (store brand equivalent is fine)  If no improvement, let me know.  Take care,  Inda Coke PA-C

## 2019-07-09 NOTE — Progress Notes (Signed)
Chad Schmidt is a 34 y.o. male here for a new problem.  I acted as a Education administrator for [employer], PA-C Anselmo Pickler, LPN  History of Present Illness:   Chief Complaint  Patient presents with  . Rash    HPI   Rash Pt c/o red itchy rash on face and neck past few days and is spreading. Pt has hx of eczema and psoriasis and had some Clobetasol ointment that he tried but did not help. Denies new environmental factors -- new pets, detergents, facial cleansers. Denies associated URI symptoms or fevers/malaise/chills.    Past Medical History:  Diagnosis Date  . Heartburn   . Psoriasis      Social History   Socioeconomic History  . Marital status: Married    Spouse name: [place]  . Number of children: Not on file  . Years of education: Not on file  . Highest education level: Not on file  Occupational History  . Not on file  Tobacco Use  . Smoking status: Never Smoker  . Smokeless tobacco: Never Used  Substance and Sexual Activity  . Alcohol use: Yes    Alcohol/week: 14.0 standard drinks    Types: 14 Cans of beer per week  . Drug use: No  . Sexual activity: Yes  Other Topics Concern  . Not on file  Social History Narrative  . Not on file   Social Determinants of Health   Financial Resource Strain:   . Difficulty of Paying Living Expenses: Not on file  Food Insecurity:   . Worried About Charity fundraiser in the Last Year: Not on file  . Ran Out of Food in the Last Year: Not on file  Transportation Needs:   . Lack of Transportation (Medical): Not on file  . Lack of Transportation (Non-Medical): Not on file  Physical Activity:   . Days of Exercise per Week: Not on file  . Minutes of Exercise per Session: Not on file  Stress:   . Feeling of Stress : Not on file  Social Connections:   . Frequency of Communication with Friends and Family: Not on file  . Frequency of Social Gatherings with Friends and Family: Not on file  . Attends Religious Services: Not on  file  . Active Member of Clubs or Organizations: Not on file  . Attends Archivist Meetings: Not on file  . Marital Status: Not on file  Intimate Partner Violence:   . Fear of Current or Ex-Partner: Not on file  . Emotionally Abused: Not on file  . Physically Abused: Not on file  . Sexually Abused: Not on file    Past Surgical History:  Procedure Laterality Date  . WISDOM TOOTH EXTRACTION Bilateral 2004    History reviewed. No pertinent family history.  No Known Allergies  Current Medications:   Current Outpatient Medications:  .  albuterol (VENTOLIN HFA) 108 (90 Base) MCG/ACT inhaler, Inhale into the lungs., Disp: , Rfl:  .  Cholecalciferol (VITAMIN D) 50 MCG (2000 UT) tablet, Take 2,000 Units by mouth daily., Disp: , Rfl:  .  clobetasol ointment (TEMOVATE) [id number] %, Apply 1 application topically daily. , Disp: , Rfl:  .  ibuprofen (ADVIL,MOTRIN) 200 MG tablet, Take 600 mg by mouth every 6 (six) hours as needed for pain., Disp: , Rfl:  .  Krill Oil 500 MG CAPS, Take 1 capsule by mouth daily., Disp: , Rfl:  .  Omega-3 Fatty Acids (FISH OIL) 1000 MG CAPS, Take 1  capsule by mouth daily., Disp: , Rfl:  .  valACYclovir (VALTREX) 500 MG tablet, Take 1 tablet (500 mg total) by mouth 2 (two) times daily. For Outbreaks x 4 days., Disp: 24 tablet, Rfl: 1 .  predniSONE (DELTASONE) 10 MG tablet, Take two tablets daily x 1 week, then one tablet daily x 1 week, Disp: 21 tablet, Rfl: 0   Review of Systems:   ROS  Negative unless otherwise specified per HPI.  Vitals:   Vitals:   07/09/19 1302  BP: 110/78  Pulse: 76  Temp: 98.1 F (36.7 C)  TempSrc: Temporal  SpO2: 97%  Weight: 202 lb 8 oz (91.9 kg)  Height: 6\' 2"  (1.88 m)     Body mass index is 26 kg/m.  Physical Exam:   Physical Exam Vitals and nursing note reviewed.  Constitutional:      Appearance: He is well-developed.  HENT:     Head: Normocephalic.  Eyes:     Conjunctiva/sclera: Conjunctivae normal.      Pupils: Pupils are equal, round, and reactive to light.  Pulmonary:     Effort: Pulmonary effort is normal.  Musculoskeletal:        General: Normal range of motion.     Cervical back: Normal range of motion.  Skin:    General: Skin is warm and dry.     Comments: Diffuse erythema to bilateral cheeks R > L; no open areas or pustules  Neurological:     Mental Status: He is alert and oriented to person, place, and time.  Psychiatric:        Behavior: Behavior normal.        Thought Content: Thought content normal.        Judgment: Judgment normal.      Assessment and Plan:   Chad was seen today for rash.  Diagnoses and all orders for this visit:  Rash and nonspecific skin eruption -     methylPREDNISolone acetate (DEPO-MEDROL) injection 40 mg  Other orders -     predniSONE (DELTASONE) 10 MG tablet; Take two tablets daily x 1 week, then one tablet daily x 1 week   Unclear etiology. Suspect some type of dermatitis. No red flags on my exam --- no airway/lip swelling or involvement. Received depomedrol injection in office and tolerated without issues. Start oral prednisone tomorrow to prevent potential rebound rash. Start oral antihistamine, recommended also taking daily pepcid or other antacid during this treatment as well. Follow-up if worsening or other concerns.  . Reviewed expectations re: course of current medical issues. . Discussed self-management of symptoms. . Outlined signs and symptoms indicating need for more acute intervention. . Patient verbalized understanding and all questions were answered. . See orders for this visit as documented in the electronic medical record. . Patient received an After-Visit Summary.  CMA or LPN served as scribe during this visit. History, Physical, and Plan performed by medical provider. The above documentation has been reviewed and is accurate and complete.  Inda Coke, PA-C

## 2019-07-22 ENCOUNTER — Encounter: Payer: BC Managed Care – PPO | Admitting: Physician Assistant

## 2019-08-02 ENCOUNTER — Other Ambulatory Visit: Payer: Self-pay | Admitting: Physician Assistant

## 2019-11-27 DIAGNOSIS — R7989 Other specified abnormal findings of blood chemistry: Secondary | ICD-10-CM | POA: Diagnosis not present

## 2019-12-03 ENCOUNTER — Other Ambulatory Visit: Payer: Self-pay | Admitting: Gastroenterology

## 2019-12-03 ENCOUNTER — Ambulatory Visit (INDEPENDENT_AMBULATORY_CARE_PROVIDER_SITE_OTHER): Payer: BC Managed Care – PPO | Admitting: Physician Assistant

## 2019-12-03 ENCOUNTER — Encounter: Payer: Self-pay | Admitting: Physician Assistant

## 2019-12-03 ENCOUNTER — Other Ambulatory Visit: Payer: Self-pay

## 2019-12-03 VITALS — BP 126/78 | HR 61 | Temp 97.3°F | Ht 74.0 in | Wt 200.6 lb

## 2019-12-03 DIAGNOSIS — Z0000 Encounter for general adult medical examination without abnormal findings: Secondary | ICD-10-CM

## 2019-12-03 DIAGNOSIS — Z136 Encounter for screening for cardiovascular disorders: Secondary | ICD-10-CM

## 2019-12-03 DIAGNOSIS — R7989 Other specified abnormal findings of blood chemistry: Secondary | ICD-10-CM | POA: Diagnosis not present

## 2019-12-03 DIAGNOSIS — Z13220 Encounter for screening for lipoid disorders: Secondary | ICD-10-CM | POA: Diagnosis not present

## 2019-12-03 DIAGNOSIS — R1011 Right upper quadrant pain: Secondary | ICD-10-CM

## 2019-12-03 LAB — CBC WITH DIFFERENTIAL/PLATELET
Basophils Absolute: 0 10*3/uL (ref 0.0–0.1)
Basophils Relative: 0.6 % (ref 0.0–3.0)
Eosinophils Absolute: 0.1 10*3/uL (ref 0.0–0.7)
Eosinophils Relative: 2.3 % (ref 0.0–5.0)
HCT: 42.8 % (ref 39.0–52.0)
Hemoglobin: 14.6 g/dL (ref 13.0–17.0)
Lymphocytes Relative: 31.7 % (ref 12.0–46.0)
Lymphs Abs: 1.3 10*3/uL (ref 0.7–4.0)
MCHC: 34.1 g/dL (ref 30.0–36.0)
MCV: 90.5 fl (ref 78.0–100.0)
Monocytes Absolute: 0.5 10*3/uL (ref 0.1–1.0)
Monocytes Relative: 12.6 % — ABNORMAL HIGH (ref 3.0–12.0)
Neutro Abs: 2.2 10*3/uL (ref 1.4–7.7)
Neutrophils Relative %: 52.8 % (ref 43.0–77.0)
Platelets: 244 10*3/uL (ref 150.0–400.0)
RBC: 4.73 Mil/uL (ref 4.22–5.81)
RDW: 12.9 % (ref 11.5–15.5)
WBC: 4.2 10*3/uL (ref 4.0–10.5)

## 2019-12-03 LAB — LIPID PANEL
Cholesterol: 152 mg/dL (ref 0–200)
HDL: 29.3 mg/dL — ABNORMAL LOW (ref >39.00)
LDL Cholesterol: 96 mg/dL (ref 0–99)
NonHDL: 123.1
Total CHOL/HDL Ratio: 5
Triglycerides: 136 mg/dL (ref 0.0–149.0)
VLDL: 27.2 mg/dL (ref 0.0–40.0)

## 2019-12-03 LAB — COMPREHENSIVE METABOLIC PANEL
ALT: 59 U/L — ABNORMAL HIGH (ref 0–53)
AST: 25 U/L (ref 0–37)
Albumin: 5 g/dL (ref 3.5–5.2)
Alkaline Phosphatase: 75 U/L (ref 39–117)
BUN: 15 mg/dL (ref 6–23)
CO2: 28 mEq/L (ref 19–32)
Calcium: 10.1 mg/dL (ref 8.4–10.5)
Chloride: 101 mEq/L (ref 96–112)
Creatinine, Ser: 0.92 mg/dL (ref 0.40–1.50)
GFR: 92.98 mL/min (ref >60.00)
Glucose, Bld: 73 mg/dL (ref 70–99)
Potassium: 4.2 mEq/L (ref 3.5–5.1)
Sodium: 139 mEq/L (ref 135–145)
Total Bilirubin: 0.6 mg/dL (ref 0.2–1.2)
Total Protein: 7.4 g/dL (ref 6.0–8.3)

## 2019-12-03 NOTE — Patient Instructions (Signed)

## 2019-12-03 NOTE — Progress Notes (Signed)
I acted as a Education administrator for [employer], PA-C [place], [place]  Subjective:    Chad Schmidt is a 34 y.o. male and is here for a comprehensive physical exam.  HPI    Health Maintenance Due  Topic Date Due  . HIV Screening  Never done  . TETANUS/TDAP  Never done    Acute Concerns: None  Chronic Issues: Elevated LFT's -- sees GI and they are planning u/s. Recently had an increase in his liver labs by around 30 points, per his report. He has significantly changed his diet since this appointment around 10 days ago -- has cut out all alcohol, reduced protein intake, and increased fruits/veggies.  Health Maintenance: Immunizations -- UTD Colonoscopy -- N/A PSA -- N/A Diet -- Maintaining healthy eating Sleep habits -- overall good; melatonin occasionally Exercise -- strength and cardio regularly Weight -- Weight: 200 lb 9.6 oz (91 kg)  Weight history Wt Readings from Last 10 Encounters:  12/03/19 200 lb 9.6 oz (91 kg)  07/09/19 202 lb 8 oz (91.9 kg)  02/06/19 205 lb (93 kg)  01/18/19 203 lb (92.1 kg)   Body mass index is 25.76 kg/m. Mood -- good Tobacco use --    Tobacco Use: Low Risk   . Smoking Tobacco Use: Never Smoker  . Smokeless Tobacco Use: Never Used    Alcohol use --- none right now   Depression screen PHQ 2/9 01/18/2019  Decreased Interest 0  Down, Depressed, Hopeless 0  PHQ - 2 Score 0     Other providers/specialists: Patient Care Team: Inda Coke, [place] as PCP - General (Physician Assistant) Otis Brace, MD as Consulting Physician (Gastroenterology) Lavonna Monarch, MD as Consulting Physician (Dermatology)   PMHx, SurgHx, SocialHx, Medications, and Allergies were reviewed in the Visit Navigator and updated as appropriate.   Past Medical History:  Diagnosis Date  . Heartburn   . Psoriasis      Past Surgical History:  Procedure Laterality Date  . WISDOM TOOTH EXTRACTION Bilateral 2004     Family History  Problem Relation  Age of Onset  . Breast cancer Mother   . Prostate cancer Neg Hx   . Colon cancer Neg Hx     Social History   Tobacco Use  . Smoking status: Never Smoker  . Smokeless tobacco: Never Used  Vaping Use  . Vaping Use: Never used  Substance Use Topics  . Alcohol use: Yes    Alcohol/week: 14.0 standard drinks    Types: 14 Cans of beer per week  . Drug use: No    Review of Systems:   Review of Systems  Constitutional: Negative for chills, fever, malaise/fatigue and weight loss.  HENT: Negative for hearing loss, sinus pain and sore throat.   Respiratory: Negative for cough and hemoptysis.   Cardiovascular: Negative for chest pain, palpitations, leg swelling and PND.  Gastrointestinal: Negative for abdominal pain, constipation, diarrhea, heartburn, nausea and vomiting.  Genitourinary: Negative for dysuria, frequency and urgency.  Musculoskeletal: Negative for back pain, myalgias and neck pain.  Skin: Negative for itching and rash.  Neurological: Negative for dizziness, tingling, seizures and headaches.  Endo/Heme/Allergies: Negative for polydipsia.  Psychiatric/Behavioral: Negative for depression. The patient is not nervous/anxious.     Objective:   Vitals:   12/03/19 0837  BP: 126/78  Pulse: 61  Temp: (!) 97.3 F (36.3 C)  SpO2: 95%   Body mass index is 25.76 kg/m.  General Appearance:  Alert, cooperative, no distress, appears stated age  Head:  Normocephalic, without obvious abnormality, atraumatic  Eyes:  PERRL, conjunctiva/corneas clear, EOM's intact, fundi benign, both eyes       Ears:  Normal TM's and external ear canals, both ears  Nose: Nares normal, septum midline, mucosa normal, no drainage    or sinus tenderness  Throat: Lips, mucosa, and tongue normal; teeth and gums normal  Neck: Supple, symmetrical, trachea midline, no adenopathy; thyroid:  No enlargement/tenderness/nodules; no carotit bruit or JVD  Back:   Symmetric, no curvature, ROM normal, no CVA  tenderness  Lungs:   Clear to auscultation bilaterally, respirations unlabored  Chest wall:  No tenderness or deformity  Heart:  Regular rate and rhythm, S1 and S2 normal, no murmur, rub   or gallop  Abdomen:   Soft, non-tender, bowel sounds active all four quadrants, no masses, no organomegaly  Extremities: Extremities normal, atraumatic, no cyanosis or edema  Prostate: Not done.   Skin: Skin color, texture, turgor normal, no rashes or lesions  Lymph nodes: Cervical, supraclavicular, and axillary nodes normal  Neurologic: CNII-XII grossly intact. Normal strength, sensation and reflexes throughout    Assessment/Plan:   Chad was seen today for annual exam.  Diagnoses and all orders for this visit:  Routine physical examination Today patient counseled on age appropriate routine health concerns for screening and prevention, each reviewed and up to date or declined. Immunizations reviewed and up to date or declined. Labs ordered and reviewed. Risk factors for depression reviewed and negative. Hearing function and visual acuity are intact. ADLs screened and addressed as needed. Functional ability and level of safety reviewed and appropriate. Education, counseling and referrals performed based on assessed risks today. Patient provided with a copy of personalized plan for preventive services. -     CBC w/Diff  Elevated LFTs Management per GI. He has had made significant improved dietary changes.  -     Comp Met (CMET)  Encounter for lipid screening for cardiovascular disease -     Lipid panel  Well Adult Exam: Labs ordered: Yes. Patient counseling was done. See below for items discussed. Discussed the patient's BMI.  The BMI is in the acceptable range Follow up in one year.  Patient Counseling: '[x]'   Nutrition: Stressed importance of moderation in sodium/caffeine intake, saturated fat and cholesterol, caloric balance, sufficient intake of fresh fruits, vegetables, and fiber.  '[x]'   Stressed  the importance of regular exercise.   '[]'   Substance Abuse: Discussed cessation/primary prevention of tobacco, alcohol, or other drug use; driving or other dangerous activities under the influence; availability of treatment for abuse.   '[x]'   Injury prevention: Discussed safety belts, safety helmets, smoke detector, smoking near bedding or upholstery.   '[]'   Sexuality: Discussed sexually transmitted diseases, partner selection, use of condoms, avoidance of unintended pregnancy  and contraceptive alternatives.   '[x]'   Dental health: Discussed importance of regular tooth brushing, flossing, and dental visits.  '[x]'   Health maintenance and immunizations reviewed. Please refer to Health maintenance section.    CMA or LPN served as scribe during this visit. History, Physical, and Plan performed by medical provider. The above documentation has been reviewed and is accurate and complete.  Inda Coke, PA-C [place]

## 2019-12-06 ENCOUNTER — Ambulatory Visit
Admission: RE | Admit: 2019-12-06 | Discharge: 2019-12-06 | Disposition: A | Discharge status: Home / Self Care | Payer: BC Managed Care – PPO | Source: Ambulatory Visit | Attending: Gastroenterology | Admitting: Gastroenterology

## 2019-12-06 DIAGNOSIS — R1011 Right upper quadrant pain: Secondary | ICD-10-CM

## 2020-01-15 ENCOUNTER — Other Ambulatory Visit: Payer: Self-pay | Admitting: Physician Assistant

## 2020-03-05 DIAGNOSIS — R7989 Other specified abnormal findings of blood chemistry: Secondary | ICD-10-CM | POA: Diagnosis not present

## 2020-03-05 DIAGNOSIS — L409 Psoriasis, unspecified: Secondary | ICD-10-CM | POA: Diagnosis not present

## 2020-03-05 DIAGNOSIS — K219 Gastro-esophageal reflux disease without esophagitis: Secondary | ICD-10-CM | POA: Diagnosis not present

## 2020-03-05 DIAGNOSIS — M546 Pain in thoracic spine: Secondary | ICD-10-CM | POA: Diagnosis not present

## 2020-05-14 DIAGNOSIS — K219 Gastro-esophageal reflux disease without esophagitis: Secondary | ICD-10-CM | POA: Diagnosis not present

## 2020-05-14 DIAGNOSIS — R945 Abnormal results of liver function studies: Secondary | ICD-10-CM | POA: Diagnosis not present

## 2020-05-19 DIAGNOSIS — R945 Abnormal results of liver function studies: Secondary | ICD-10-CM | POA: Diagnosis not present

## 2020-08-17 DIAGNOSIS — R945 Abnormal results of liver function studies: Secondary | ICD-10-CM | POA: Diagnosis not present

## 2020-12-17 DIAGNOSIS — H01021 Squamous blepharitis right upper eyelid: Secondary | ICD-10-CM | POA: Diagnosis not present

## 2021-02-11 DIAGNOSIS — M25512 Pain in left shoulder: Secondary | ICD-10-CM | POA: Diagnosis not present

## 2021-02-24 DIAGNOSIS — M25512 Pain in left shoulder: Secondary | ICD-10-CM | POA: Diagnosis not present

## 2021-03-08 DIAGNOSIS — M25512 Pain in left shoulder: Secondary | ICD-10-CM | POA: Diagnosis not present

## 2021-04-09 DIAGNOSIS — L400 Psoriasis vulgaris: Secondary | ICD-10-CM | POA: Diagnosis not present

## 2021-04-09 DIAGNOSIS — L821 Other seborrheic keratosis: Secondary | ICD-10-CM | POA: Diagnosis not present

## 2021-04-09 DIAGNOSIS — L218 Other seborrheic dermatitis: Secondary | ICD-10-CM | POA: Diagnosis not present

## 2021-06-16 DIAGNOSIS — Z23 Encounter for immunization: Secondary | ICD-10-CM | POA: Diagnosis not present

## 2021-06-16 DIAGNOSIS — S81851A Open bite, right lower leg, initial encounter: Secondary | ICD-10-CM | POA: Diagnosis not present

## 2021-08-19 DIAGNOSIS — Z0000 Encounter for general adult medical examination without abnormal findings: Secondary | ICD-10-CM | POA: Diagnosis not present

## 2021-08-19 DIAGNOSIS — R946 Abnormal results of thyroid function studies: Secondary | ICD-10-CM | POA: Diagnosis not present

## 2021-08-19 DIAGNOSIS — R7309 Other abnormal glucose: Secondary | ICD-10-CM | POA: Diagnosis not present

## 2021-08-26 DIAGNOSIS — Z0000 Encounter for general adult medical examination without abnormal findings: Secondary | ICD-10-CM | POA: Diagnosis not present

## 2021-08-26 DIAGNOSIS — R3912 Poor urinary stream: Secondary | ICD-10-CM | POA: Diagnosis not present

## 2021-08-26 DIAGNOSIS — R7401 Elevation of levels of liver transaminase levels: Secondary | ICD-10-CM | POA: Diagnosis not present

## 2021-08-26 DIAGNOSIS — R339 Retention of urine, unspecified: Secondary | ICD-10-CM | POA: Diagnosis not present

## 2021-08-31 ENCOUNTER — Other Ambulatory Visit: Payer: Self-pay | Admitting: Gastroenterology

## 2021-08-31 ENCOUNTER — Ambulatory Visit
Admission: RE | Admit: 2021-08-31 | Discharge: 2021-08-31 | Disposition: A | Discharge status: Home / Self Care | Payer: BC Managed Care – PPO | Source: Ambulatory Visit | Attending: Gastroenterology | Admitting: Gastroenterology

## 2021-08-31 DIAGNOSIS — R748 Abnormal levels of other serum enzymes: Secondary | ICD-10-CM | POA: Diagnosis not present

## 2021-08-31 DIAGNOSIS — K824 Cholesterolosis of gallbladder: Secondary | ICD-10-CM | POA: Diagnosis not present

## 2021-08-31 DIAGNOSIS — K219 Gastro-esophageal reflux disease without esophagitis: Secondary | ICD-10-CM | POA: Diagnosis not present

## 2021-08-31 DIAGNOSIS — K5900 Constipation, unspecified: Secondary | ICD-10-CM | POA: Diagnosis not present

## 2021-08-31 DIAGNOSIS — R1011 Right upper quadrant pain: Secondary | ICD-10-CM

## 2021-10-13 DIAGNOSIS — K812 Acute cholecystitis with chronic cholecystitis: Secondary | ICD-10-CM | POA: Diagnosis not present

## 2021-10-13 DIAGNOSIS — K811 Chronic cholecystitis: Secondary | ICD-10-CM | POA: Diagnosis not present

## 2021-10-13 DIAGNOSIS — K760 Fatty (change of) liver, not elsewhere classified: Secondary | ICD-10-CM | POA: Diagnosis not present

## 2022-02-08 DIAGNOSIS — M545 Low back pain, unspecified: Secondary | ICD-10-CM | POA: Diagnosis not present

## 2022-02-21 ENCOUNTER — Encounter: Payer: Self-pay | Admitting: *Deleted

## 2022-02-28 DIAGNOSIS — R1011 Right upper quadrant pain: Secondary | ICD-10-CM | POA: Diagnosis not present

## 2022-02-28 DIAGNOSIS — R945 Abnormal results of liver function studies: Secondary | ICD-10-CM | POA: Diagnosis not present

## 2022-03-22 DIAGNOSIS — M545 Low back pain, unspecified: Secondary | ICD-10-CM | POA: Diagnosis not present

## 2022-05-12 ENCOUNTER — Encounter: Payer: Self-pay | Admitting: *Deleted

## 2022-06-10 ENCOUNTER — Other Ambulatory Visit: Payer: Self-pay | Admitting: Gastroenterology

## 2022-06-10 DIAGNOSIS — R945 Abnormal results of liver function studies: Secondary | ICD-10-CM | POA: Diagnosis not present

## 2022-06-10 DIAGNOSIS — R1011 Right upper quadrant pain: Secondary | ICD-10-CM

## 2022-06-10 DIAGNOSIS — R112 Nausea with vomiting, unspecified: Secondary | ICD-10-CM

## 2022-06-10 DIAGNOSIS — K219 Gastro-esophageal reflux disease without esophagitis: Secondary | ICD-10-CM | POA: Diagnosis not present

## 2022-06-23 DIAGNOSIS — R3912 Poor urinary stream: Secondary | ICD-10-CM | POA: Diagnosis not present

## 2022-06-23 DIAGNOSIS — N401 Enlarged prostate with lower urinary tract symptoms: Secondary | ICD-10-CM | POA: Diagnosis not present

## 2022-06-23 DIAGNOSIS — R6882 Decreased libido: Secondary | ICD-10-CM | POA: Diagnosis not present

## 2022-06-24 IMAGING — US US ABDOMEN LIMITED
1 series · 14 of 25 positions shown · non-contrast
Comparison: None.

CLINICAL DATA: Right upper quadrant pain

EXAM:
ULTRASOUND ABDOMEN LIMITED RIGHT UPPER QUADRANT

[Series 1: us abdomen limited · 0.22mm/px · 14 of 45 slices shown]
[im 1/45]
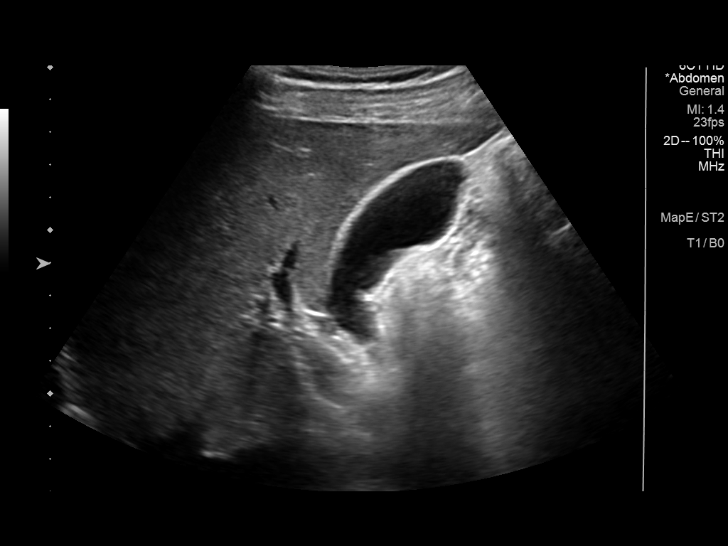
[im 4/45]
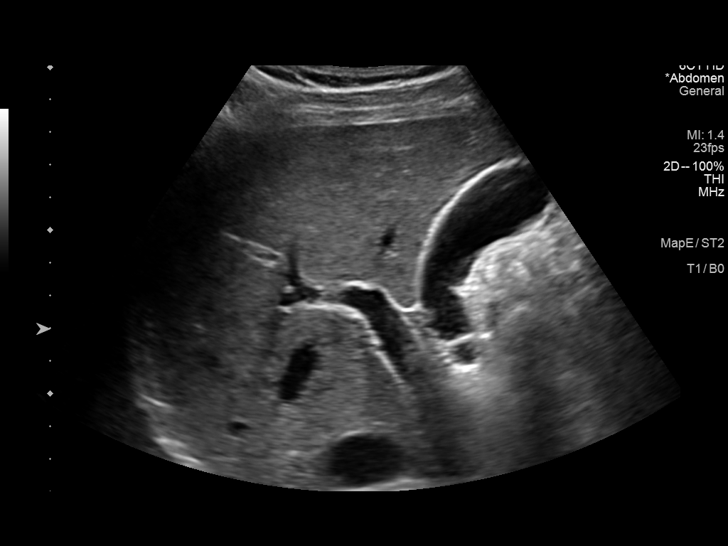
[im 8/45]
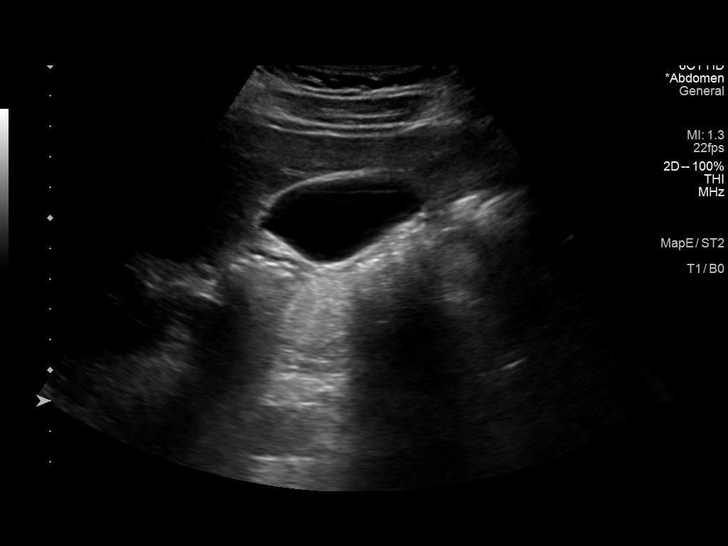
[im 12/45]
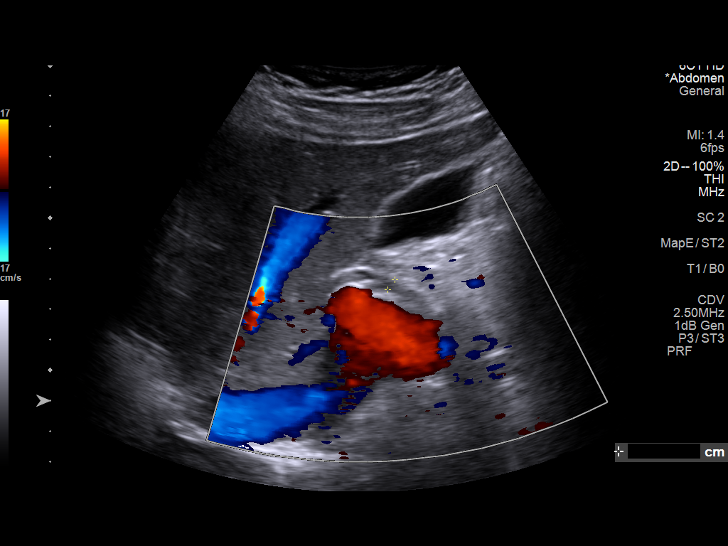
[im 15/45]
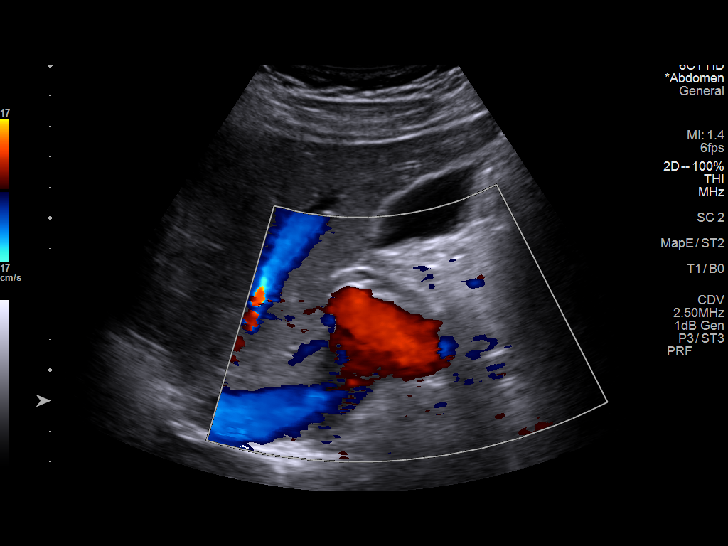
[im 17/45]
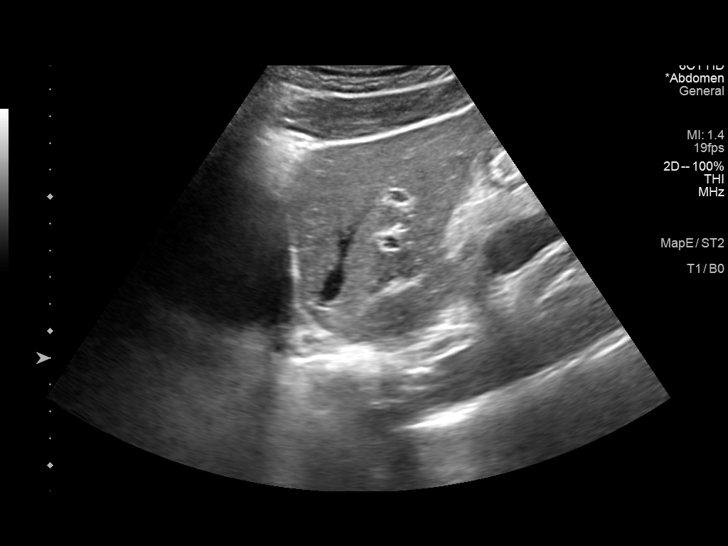
[im 21/45]
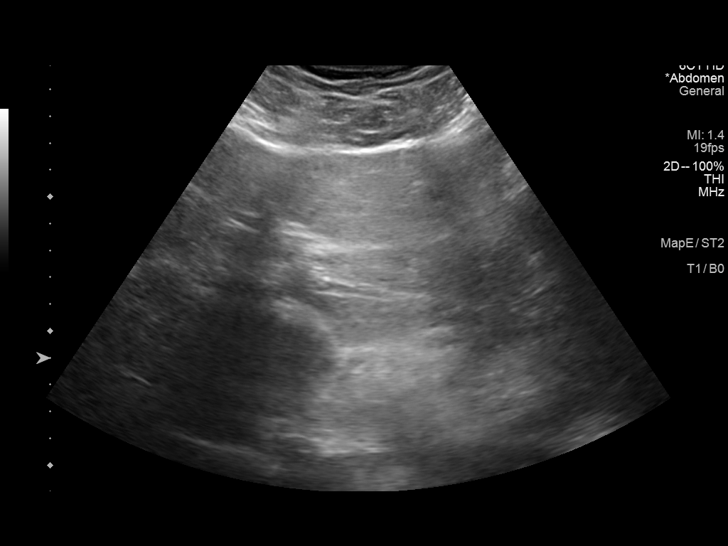
[im 24/45]
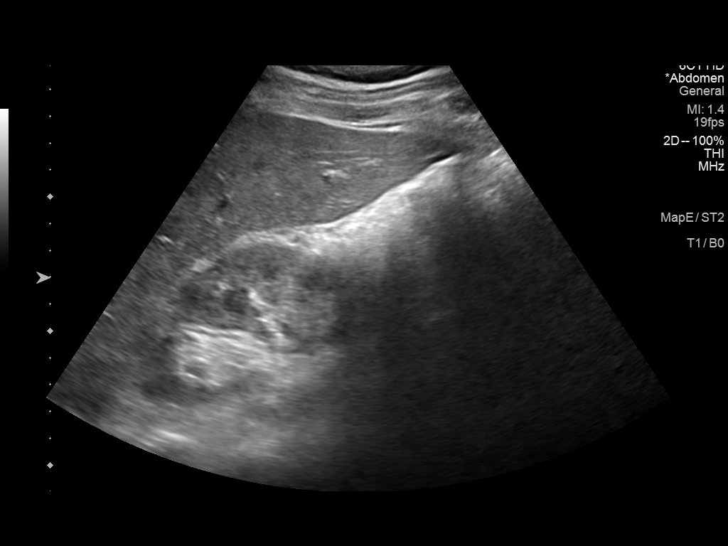
[im 28/45]
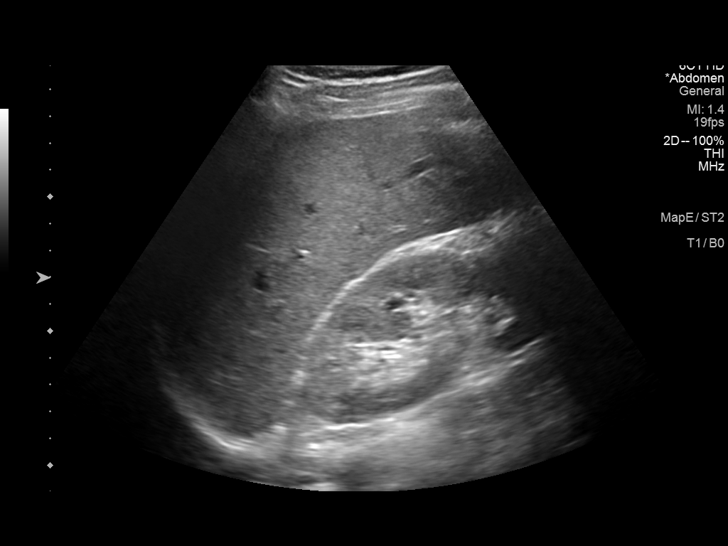
[im 30/45]
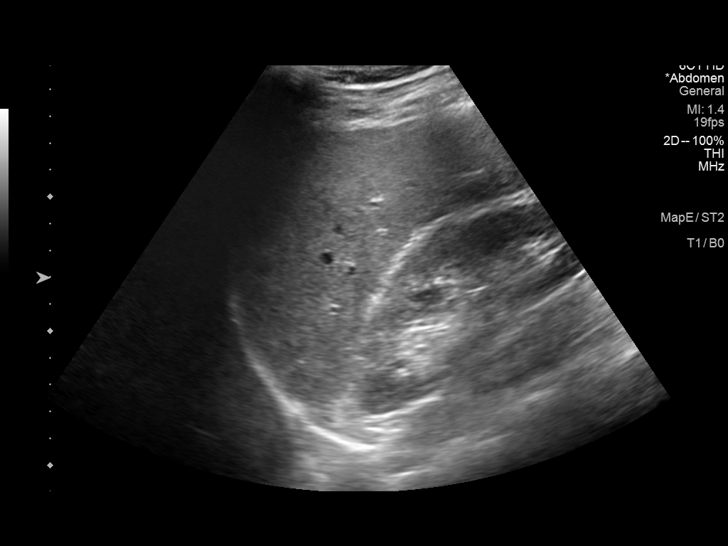
[im 34/45]
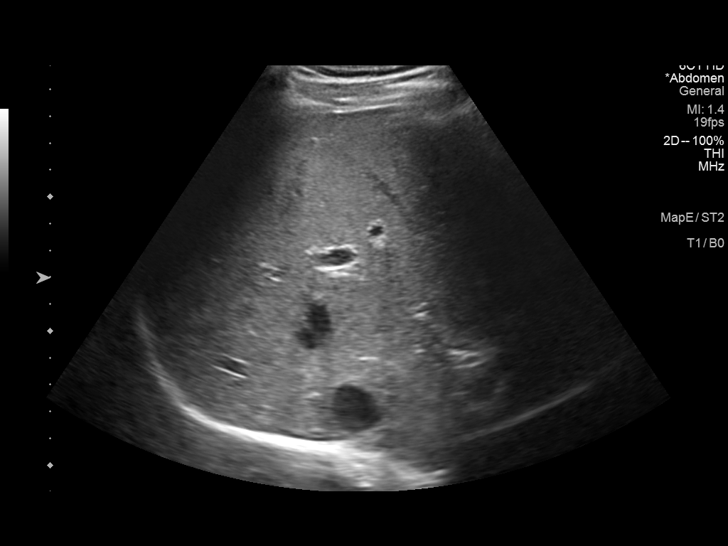
[im 37/45]
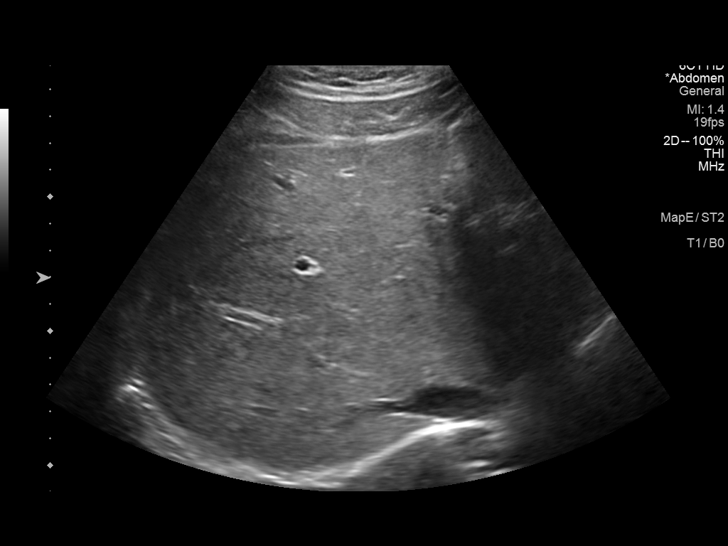
[im 41/45]
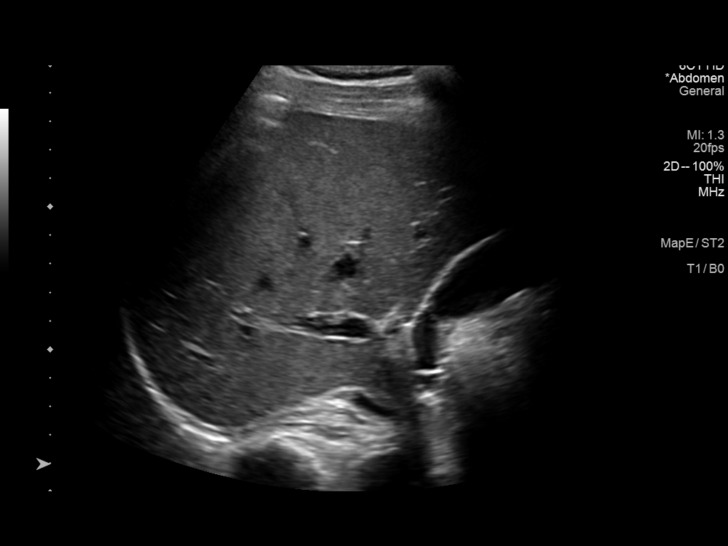
[im 45/45]
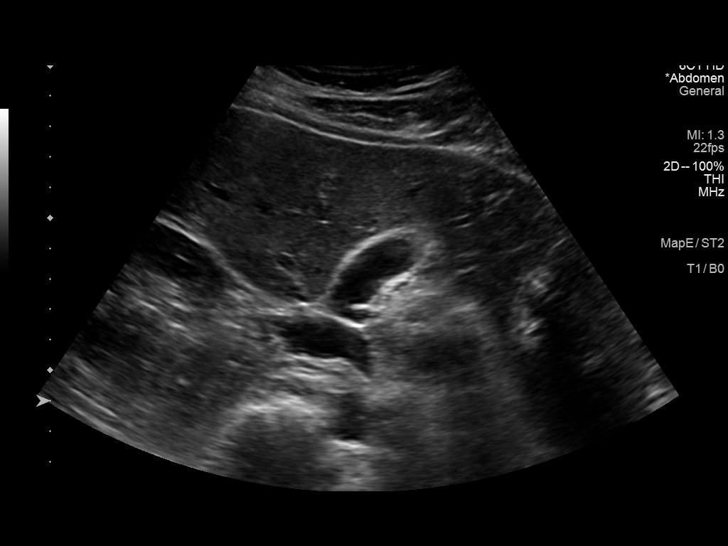

[14 of 25 positions shown; findings below may reference images not displayed]

FINDINGS: Gallbladder:

No gallstones or wall thickening visualized. No sonographic Murphy
sign noted by sonographer.

Common bile duct:

Diameter: 4.5 mm

Liver:

No focal lesion identified. Within normal limits in parenchymal
echogenicity. Portal vein is patent on color Doppler imaging with
normal direction of blood flow towards the liver.

Other: None.
IMPRESSION: Negative right upper quadrant abdominal ultrasound

## 2022-07-01 DIAGNOSIS — R7401 Elevation of levels of liver transaminase levels: Secondary | ICD-10-CM | POA: Diagnosis not present

## 2022-07-01 DIAGNOSIS — R946 Abnormal results of thyroid function studies: Secondary | ICD-10-CM | POA: Diagnosis not present

## 2022-07-01 DIAGNOSIS — R030 Elevated blood-pressure reading, without diagnosis of hypertension: Secondary | ICD-10-CM | POA: Diagnosis not present

## 2022-07-01 DIAGNOSIS — E781 Pure hyperglyceridemia: Secondary | ICD-10-CM | POA: Diagnosis not present

## 2022-07-12 ENCOUNTER — Other Ambulatory Visit: Payer: BC Managed Care – PPO

## 2022-07-13 ENCOUNTER — Other Ambulatory Visit: Payer: BC Managed Care – PPO

## 2022-07-22 DIAGNOSIS — R946 Abnormal results of thyroid function studies: Secondary | ICD-10-CM | POA: Diagnosis not present

## 2022-07-22 DIAGNOSIS — R7309 Other abnormal glucose: Secondary | ICD-10-CM | POA: Diagnosis not present

## 2022-07-22 DIAGNOSIS — R7401 Elevation of levels of liver transaminase levels: Secondary | ICD-10-CM | POA: Diagnosis not present

## 2022-07-22 DIAGNOSIS — E781 Pure hyperglyceridemia: Secondary | ICD-10-CM | POA: Diagnosis not present

## 2022-07-22 DIAGNOSIS — R030 Elevated blood-pressure reading, without diagnosis of hypertension: Secondary | ICD-10-CM | POA: Diagnosis not present

## 2022-07-28 DIAGNOSIS — R6882 Decreased libido: Secondary | ICD-10-CM | POA: Diagnosis not present

## 2022-07-29 DIAGNOSIS — Z0000 Encounter for general adult medical examination without abnormal findings: Secondary | ICD-10-CM | POA: Diagnosis not present

## 2022-08-02 ENCOUNTER — Other Ambulatory Visit: Payer: BC Managed Care – PPO

## 2022-08-09 ENCOUNTER — Ambulatory Visit
Admission: RE | Admit: 2022-08-09 | Discharge: 2022-08-09 | Disposition: A | Discharge status: Home / Self Care | Payer: BC Managed Care – PPO | Source: Ambulatory Visit | Attending: Gastroenterology | Admitting: Gastroenterology

## 2022-08-09 DIAGNOSIS — Z9049 Acquired absence of other specified parts of digestive tract: Secondary | ICD-10-CM | POA: Diagnosis not present

## 2022-08-09 DIAGNOSIS — R1011 Right upper quadrant pain: Secondary | ICD-10-CM | POA: Diagnosis not present

## 2022-08-09 DIAGNOSIS — R112 Nausea with vomiting, unspecified: Secondary | ICD-10-CM

## 2022-08-09 MED ORDER — IOPAMIDOL (ISOVUE-300) INJECTION 61%
100.0000 mL | Freq: Once | INTRAVENOUS | Status: AC | PRN
  Administered 2022-08-09: 100 mL via INTRAVENOUS

## 2022-10-27 DIAGNOSIS — M25511 Pain in right shoulder: Secondary | ICD-10-CM | POA: Diagnosis not present

## 2022-11-01 DIAGNOSIS — M25511 Pain in right shoulder: Secondary | ICD-10-CM | POA: Diagnosis not present

## 2022-11-08 DIAGNOSIS — M25511 Pain in right shoulder: Secondary | ICD-10-CM | POA: Diagnosis not present

## 2023-01-20 DIAGNOSIS — M79676 Pain in unspecified toe(s): Secondary | ICD-10-CM | POA: Diagnosis not present

## 2023-02-20 DIAGNOSIS — Z125 Encounter for screening for malignant neoplasm of prostate: Secondary | ICD-10-CM | POA: Diagnosis not present

## 2023-02-20 DIAGNOSIS — Z1329 Encounter for screening for other suspected endocrine disorder: Secondary | ICD-10-CM | POA: Diagnosis not present

## 2023-02-20 DIAGNOSIS — E291 Testicular hypofunction: Secondary | ICD-10-CM | POA: Diagnosis not present

## 2023-03-06 DIAGNOSIS — Z6827 Body mass index (BMI) 27.0-27.9, adult: Secondary | ICD-10-CM | POA: Diagnosis not present

## 2023-03-06 DIAGNOSIS — R6882 Decreased libido: Secondary | ICD-10-CM | POA: Diagnosis not present

## 2023-03-06 DIAGNOSIS — E291 Testicular hypofunction: Secondary | ICD-10-CM | POA: Diagnosis not present

## 2023-03-06 DIAGNOSIS — R5383 Other fatigue: Secondary | ICD-10-CM | POA: Diagnosis not present

## 2023-03-31 DIAGNOSIS — R41840 Attention and concentration deficit: Secondary | ICD-10-CM | POA: Diagnosis not present

## 2023-04-03 DIAGNOSIS — Z79899 Other long term (current) drug therapy: Secondary | ICD-10-CM | POA: Diagnosis not present

## 2023-04-03 DIAGNOSIS — F8181 Disorder of written expression: Secondary | ICD-10-CM | POA: Diagnosis not present

## 2023-04-03 DIAGNOSIS — E291 Testicular hypofunction: Secondary | ICD-10-CM | POA: Diagnosis not present

## 2023-04-03 DIAGNOSIS — F902 Attention-deficit hyperactivity disorder, combined type: Secondary | ICD-10-CM | POA: Diagnosis not present

## 2023-04-06 DIAGNOSIS — Z6827 Body mass index (BMI) 27.0-27.9, adult: Secondary | ICD-10-CM | POA: Diagnosis not present

## 2023-04-06 DIAGNOSIS — R6882 Decreased libido: Secondary | ICD-10-CM | POA: Diagnosis not present

## 2023-04-06 DIAGNOSIS — R5383 Other fatigue: Secondary | ICD-10-CM | POA: Diagnosis not present

## 2023-04-06 DIAGNOSIS — E291 Testicular hypofunction: Secondary | ICD-10-CM | POA: Diagnosis not present

## 2023-04-13 DIAGNOSIS — D225 Melanocytic nevi of trunk: Secondary | ICD-10-CM | POA: Diagnosis not present

## 2023-04-13 DIAGNOSIS — Z1283 Encounter for screening for malignant neoplasm of skin: Secondary | ICD-10-CM | POA: Diagnosis not present

## 2023-04-13 DIAGNOSIS — L218 Other seborrheic dermatitis: Secondary | ICD-10-CM | POA: Diagnosis not present

## 2023-04-13 DIAGNOSIS — E291 Testicular hypofunction: Secondary | ICD-10-CM | POA: Diagnosis not present

## 2023-04-20 DIAGNOSIS — E291 Testicular hypofunction: Secondary | ICD-10-CM | POA: Diagnosis not present

## 2023-04-25 DIAGNOSIS — R945 Abnormal results of liver function studies: Secondary | ICD-10-CM | POA: Diagnosis not present

## 2023-04-26 DIAGNOSIS — E291 Testicular hypofunction: Secondary | ICD-10-CM | POA: Diagnosis not present

## 2023-05-04 DIAGNOSIS — R6882 Decreased libido: Secondary | ICD-10-CM | POA: Diagnosis not present

## 2023-05-04 DIAGNOSIS — E291 Testicular hypofunction: Secondary | ICD-10-CM | POA: Diagnosis not present

## 2023-05-04 DIAGNOSIS — F902 Attention-deficit hyperactivity disorder, combined type: Secondary | ICD-10-CM | POA: Diagnosis not present

## 2023-05-04 DIAGNOSIS — R5383 Other fatigue: Secondary | ICD-10-CM | POA: Diagnosis not present

## 2023-05-05 DIAGNOSIS — K219 Gastro-esophageal reflux disease without esophagitis: Secondary | ICD-10-CM | POA: Diagnosis not present

## 2023-05-15 DIAGNOSIS — F902 Attention-deficit hyperactivity disorder, combined type: Secondary | ICD-10-CM | POA: Diagnosis not present

## 2023-05-22 DIAGNOSIS — Z79899 Other long term (current) drug therapy: Secondary | ICD-10-CM | POA: Diagnosis not present

## 2023-05-22 DIAGNOSIS — F902 Attention-deficit hyperactivity disorder, combined type: Secondary | ICD-10-CM | POA: Diagnosis not present

## 2023-07-25 DIAGNOSIS — E781 Pure hyperglyceridemia: Secondary | ICD-10-CM | POA: Diagnosis not present

## 2023-07-25 DIAGNOSIS — R7309 Other abnormal glucose: Secondary | ICD-10-CM | POA: Diagnosis not present

## 2023-07-25 DIAGNOSIS — R946 Abnormal results of thyroid function studies: Secondary | ICD-10-CM | POA: Diagnosis not present

## 2023-07-25 DIAGNOSIS — R7401 Elevation of levels of liver transaminase levels: Secondary | ICD-10-CM | POA: Diagnosis not present

## 2023-08-01 ENCOUNTER — Other Ambulatory Visit (HOSPITAL_BASED_OUTPATIENT_CLINIC_OR_DEPARTMENT_OTHER): Payer: Self-pay | Admitting: Family Medicine

## 2023-08-01 DIAGNOSIS — R7401 Elevation of levels of liver transaminase levels: Secondary | ICD-10-CM | POA: Diagnosis not present

## 2023-08-01 DIAGNOSIS — E781 Pure hyperglyceridemia: Secondary | ICD-10-CM | POA: Diagnosis not present

## 2023-08-01 DIAGNOSIS — Z0000 Encounter for general adult medical examination without abnormal findings: Secondary | ICD-10-CM | POA: Diagnosis not present

## 2023-08-01 DIAGNOSIS — E291 Testicular hypofunction: Secondary | ICD-10-CM | POA: Diagnosis not present

## 2023-08-01 DIAGNOSIS — R5383 Other fatigue: Secondary | ICD-10-CM | POA: Diagnosis not present

## 2023-08-01 DIAGNOSIS — Z79890 Hormone replacement therapy (postmenopausal): Secondary | ICD-10-CM | POA: Diagnosis not present

## 2023-08-03 DIAGNOSIS — R5383 Other fatigue: Secondary | ICD-10-CM | POA: Diagnosis not present

## 2023-08-03 DIAGNOSIS — R6882 Decreased libido: Secondary | ICD-10-CM | POA: Diagnosis not present

## 2023-08-03 DIAGNOSIS — E291 Testicular hypofunction: Secondary | ICD-10-CM | POA: Diagnosis not present

## 2023-08-03 DIAGNOSIS — F902 Attention-deficit hyperactivity disorder, combined type: Secondary | ICD-10-CM | POA: Diagnosis not present

## 2023-08-21 DIAGNOSIS — Z79899 Other long term (current) drug therapy: Secondary | ICD-10-CM | POA: Diagnosis not present

## 2023-08-21 DIAGNOSIS — F902 Attention-deficit hyperactivity disorder, combined type: Secondary | ICD-10-CM | POA: Diagnosis not present

## 2023-09-08 ENCOUNTER — Ambulatory Visit (HOSPITAL_BASED_OUTPATIENT_CLINIC_OR_DEPARTMENT_OTHER)
Admission: RE | Admit: 2023-09-08 | Discharge: 2023-09-08 | Disposition: A | Discharge status: Home / Self Care | Payer: Self-pay | Source: Ambulatory Visit | Attending: Family Medicine | Admitting: Family Medicine

## 2023-09-08 DIAGNOSIS — E781 Pure hyperglyceridemia: Secondary | ICD-10-CM | POA: Insufficient documentation

## 2023-09-26 DIAGNOSIS — M25511 Pain in right shoulder: Secondary | ICD-10-CM | POA: Diagnosis not present

## 2023-09-26 DIAGNOSIS — M7662 Achilles tendinitis, left leg: Secondary | ICD-10-CM | POA: Diagnosis not present

## 2023-09-26 DIAGNOSIS — M7661 Achilles tendinitis, right leg: Secondary | ICD-10-CM | POA: Diagnosis not present

## 2023-10-24 DIAGNOSIS — M7661 Achilles tendinitis, right leg: Secondary | ICD-10-CM | POA: Diagnosis not present

## 2023-10-24 DIAGNOSIS — M7662 Achilles tendinitis, left leg: Secondary | ICD-10-CM | POA: Diagnosis not present

## 2023-10-25 DIAGNOSIS — E291 Testicular hypofunction: Secondary | ICD-10-CM | POA: Diagnosis not present

## 2023-10-25 DIAGNOSIS — Z79890 Hormone replacement therapy (postmenopausal): Secondary | ICD-10-CM | POA: Diagnosis not present

## 2023-10-26 DIAGNOSIS — D225 Melanocytic nevi of trunk: Secondary | ICD-10-CM | POA: Diagnosis not present

## 2023-10-26 DIAGNOSIS — Z1283 Encounter for screening for malignant neoplasm of skin: Secondary | ICD-10-CM | POA: Diagnosis not present

## 2023-10-27 DIAGNOSIS — E291 Testicular hypofunction: Secondary | ICD-10-CM | POA: Diagnosis not present

## 2023-11-01 DIAGNOSIS — E291 Testicular hypofunction: Secondary | ICD-10-CM | POA: Diagnosis not present

## 2023-11-07 DIAGNOSIS — M79671 Pain in right foot: Secondary | ICD-10-CM | POA: Diagnosis not present

## 2023-11-07 DIAGNOSIS — M79672 Pain in left foot: Secondary | ICD-10-CM | POA: Diagnosis not present

## 2023-11-08 DIAGNOSIS — L4050 Arthropathic psoriasis, unspecified: Secondary | ICD-10-CM | POA: Diagnosis not present

## 2023-11-08 DIAGNOSIS — M5459 Other low back pain: Secondary | ICD-10-CM | POA: Diagnosis not present

## 2023-11-14 DIAGNOSIS — F902 Attention-deficit hyperactivity disorder, combined type: Secondary | ICD-10-CM | POA: Diagnosis not present

## 2023-11-14 DIAGNOSIS — Z79899 Other long term (current) drug therapy: Secondary | ICD-10-CM | POA: Diagnosis not present

## 2023-11-15 DIAGNOSIS — M79672 Pain in left foot: Secondary | ICD-10-CM | POA: Diagnosis not present

## 2023-11-15 DIAGNOSIS — M79671 Pain in right foot: Secondary | ICD-10-CM | POA: Diagnosis not present

## 2023-12-04 DIAGNOSIS — M5416 Radiculopathy, lumbar region: Secondary | ICD-10-CM | POA: Diagnosis not present

## 2023-12-11 DIAGNOSIS — M5416 Radiculopathy, lumbar region: Secondary | ICD-10-CM | POA: Diagnosis not present

## 2023-12-11 DIAGNOSIS — L409 Psoriasis, unspecified: Secondary | ICD-10-CM | POA: Diagnosis not present

## 2023-12-14 DIAGNOSIS — M79671 Pain in right foot: Secondary | ICD-10-CM | POA: Diagnosis not present

## 2023-12-14 DIAGNOSIS — M79672 Pain in left foot: Secondary | ICD-10-CM | POA: Diagnosis not present

## 2023-12-18 DIAGNOSIS — M79671 Pain in right foot: Secondary | ICD-10-CM | POA: Diagnosis not present

## 2023-12-18 DIAGNOSIS — M79672 Pain in left foot: Secondary | ICD-10-CM | POA: Diagnosis not present

## 2023-12-30 DIAGNOSIS — M5416 Radiculopathy, lumbar region: Secondary | ICD-10-CM | POA: Diagnosis not present

## 2024-01-01 DIAGNOSIS — M79671 Pain in right foot: Secondary | ICD-10-CM | POA: Diagnosis not present

## 2024-01-01 DIAGNOSIS — M79672 Pain in left foot: Secondary | ICD-10-CM | POA: Diagnosis not present

## 2024-01-02 DIAGNOSIS — M79671 Pain in right foot: Secondary | ICD-10-CM | POA: Diagnosis not present

## 2024-01-02 DIAGNOSIS — M79672 Pain in left foot: Secondary | ICD-10-CM | POA: Diagnosis not present

## 2024-01-24 DIAGNOSIS — E291 Testicular hypofunction: Secondary | ICD-10-CM | POA: Diagnosis not present

## 2024-01-24 DIAGNOSIS — R5383 Other fatigue: Secondary | ICD-10-CM | POA: Diagnosis not present

## 2024-01-24 DIAGNOSIS — Z79890 Hormone replacement therapy (postmenopausal): Secondary | ICD-10-CM | POA: Diagnosis not present

## 2024-01-26 DIAGNOSIS — M2550 Pain in unspecified joint: Secondary | ICD-10-CM | POA: Diagnosis not present

## 2024-01-26 DIAGNOSIS — E291 Testicular hypofunction: Secondary | ICD-10-CM | POA: Diagnosis not present

## 2024-01-26 DIAGNOSIS — R5383 Other fatigue: Secondary | ICD-10-CM | POA: Diagnosis not present

## 2024-02-02 DIAGNOSIS — E291 Testicular hypofunction: Secondary | ICD-10-CM | POA: Diagnosis not present

## 2024-02-13 DIAGNOSIS — Z79899 Other long term (current) drug therapy: Secondary | ICD-10-CM | POA: Diagnosis not present

## 2024-02-13 DIAGNOSIS — F902 Attention-deficit hyperactivity disorder, combined type: Secondary | ICD-10-CM | POA: Diagnosis not present

## 2024-03-14 DIAGNOSIS — M25562 Pain in left knee: Secondary | ICD-10-CM | POA: Diagnosis not present

## 2024-03-14 DIAGNOSIS — M25561 Pain in right knee: Secondary | ICD-10-CM | POA: Diagnosis not present

## 2024-03-14 DIAGNOSIS — L409 Psoriasis, unspecified: Secondary | ICD-10-CM | POA: Diagnosis not present

## 2024-03-14 DIAGNOSIS — M1991 Primary osteoarthritis, unspecified site: Secondary | ICD-10-CM | POA: Diagnosis not present

## 2024-03-19 IMAGING — US US ABDOMEN COMPLETE
1 series · 13 of 25 positions shown · non-contrast
Comparison: Abdominal ultrasound 12/06/2019. MRI abdomen
02/21/2016.

CLINICAL DATA: Elevated liver enzymes and abdominal pain.

EXAM:
ABDOMEN ULTRASOUND COMPLETE

[Series 1: us abdomen complete · 0.28mm/px · 13 of 113 slices shown]
[im 1/113]
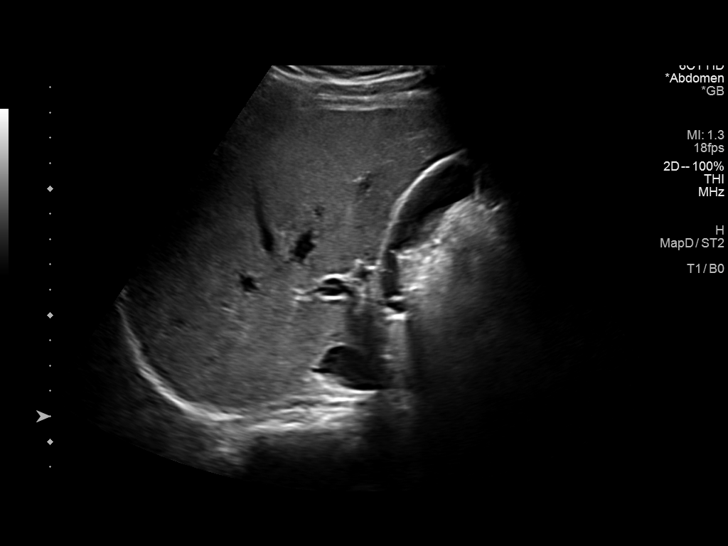
[im 10/113]
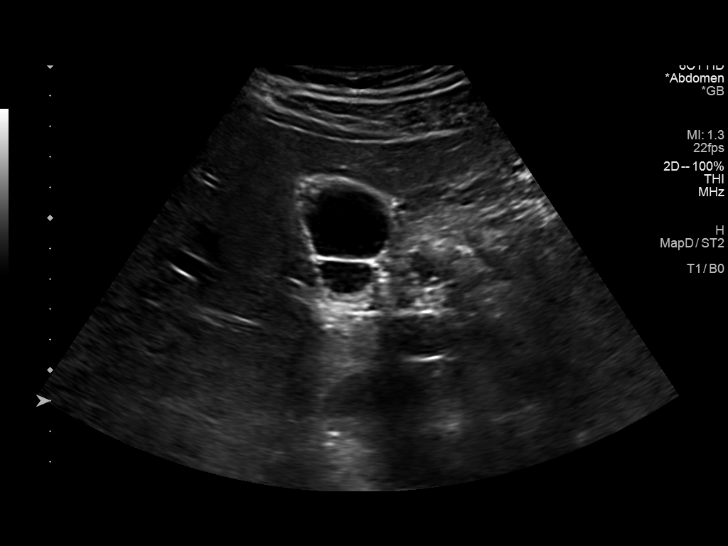
[im 19/113]
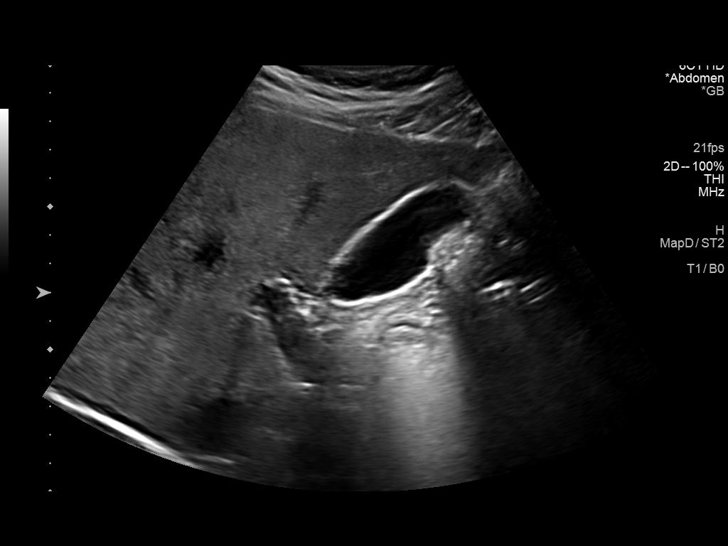
[im 29/113]
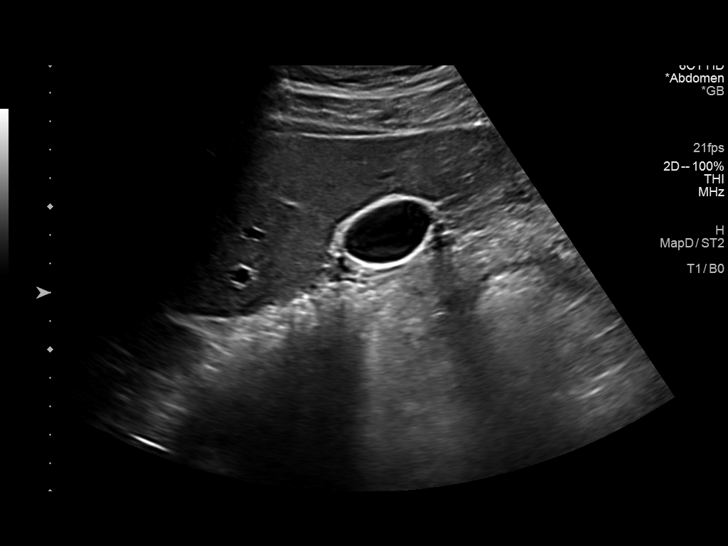
[im 38/113]
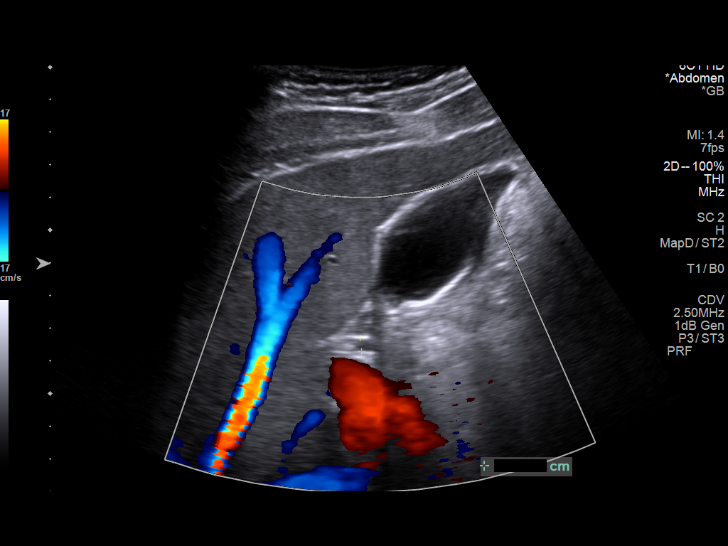
[im 47/113]
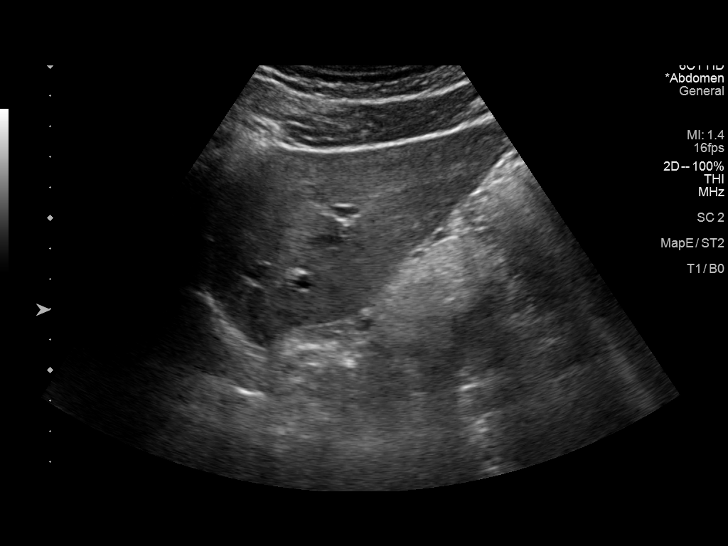
[im 57/113]
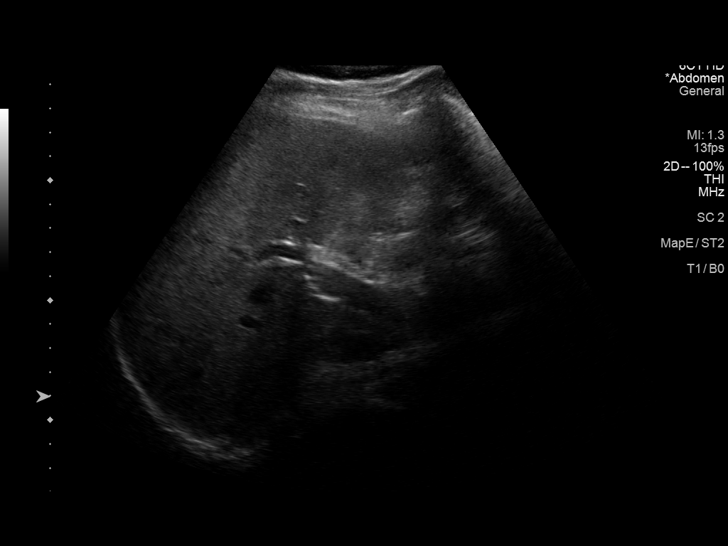
[im 66/113]
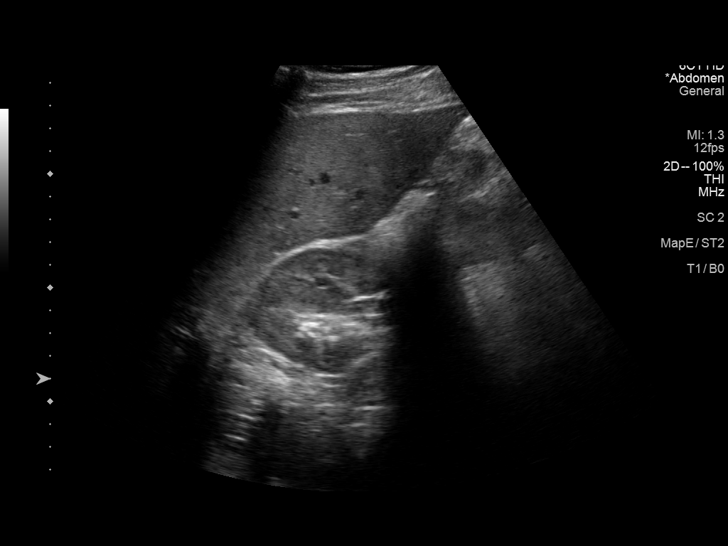
[im 75/113]
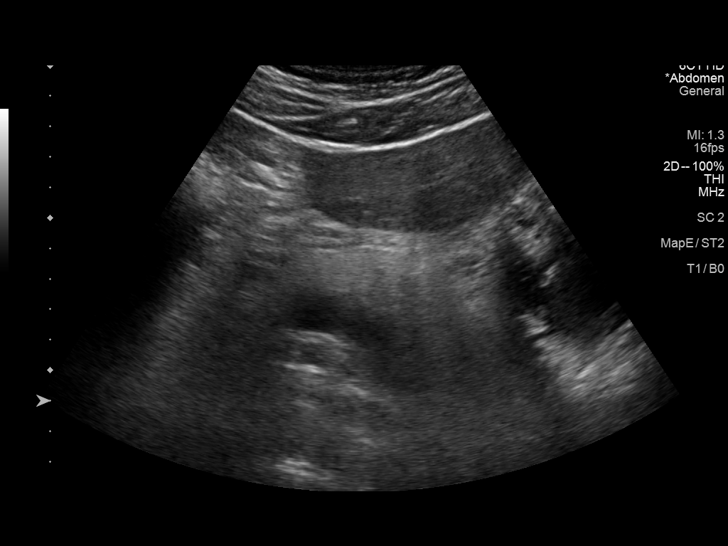
[im 85/113]
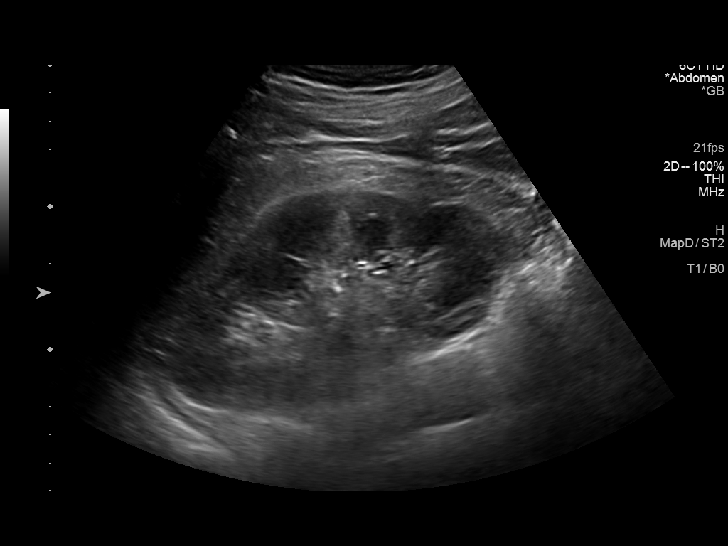
[im 94/113]
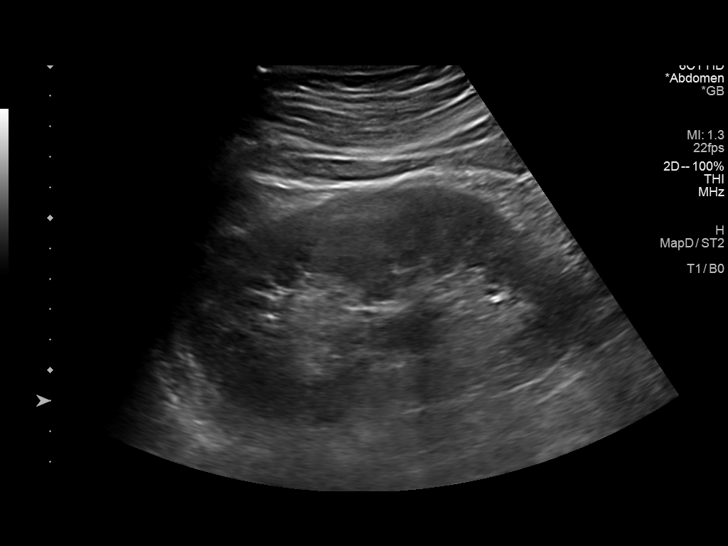
[im 103/113]
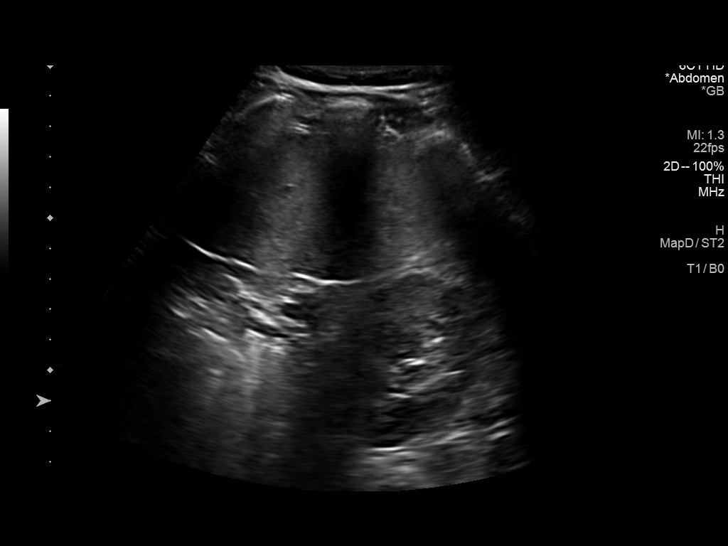
[im 113/113]
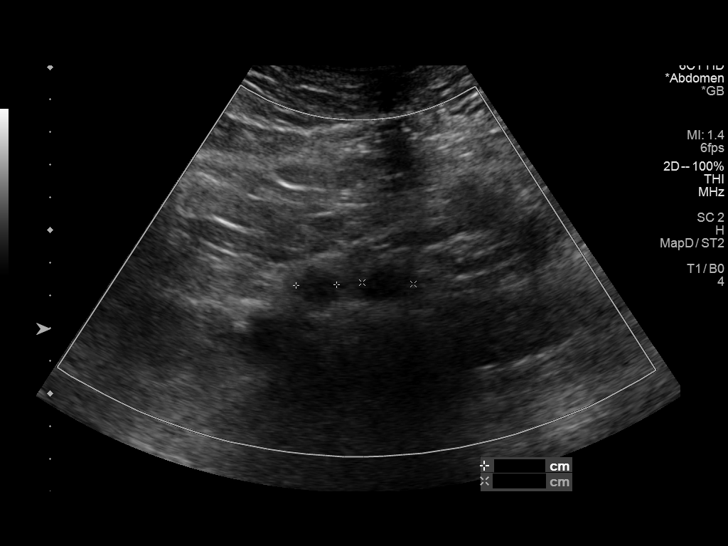

[13 of 25 positions shown; findings below may reference images not displayed]

FINDINGS: Gallbladder: There is a questionable 5 mm gallbladder polyp. There
are findings suspicious for calculus within the cystic duct seen on
image number 37 sonographic. Murphy sign is positive per
sonographer. No additional gallstones identified. Overall, the
gallbladder wall is normal in thickness, but there are some areas
which are mildly thickened measuring up to 5 mm.

Common bile duct: Diameter: 4.2 mm.

Liver: No focal lesion identified. Within normal limits in
parenchymal echogenicity. Portal vein is patent on color Doppler
imaging with normal direction of blood flow towards the liver.

IVC: No abnormality visualized.

Pancreas: Visualized portion unremarkable.

Spleen: Size and appearance within normal limits.

Right Kidney: Length: 12.5 cm. Echogenicity within normal limits. No
mass or hydronephrosis visualized.

Left Kidney: Length: 13.5 cm. Echogenicity within normal limits. No
mass or hydronephrosis visualized.

Abdominal aorta: No aneurysm visualized.  2.7 cm.

Other findings: None.
IMPRESSION: 1. Questionable calculus within the cystic duct. No additional
cholelithiasis. There is mild gallbladder wall thickening and
positive sonographic Murphy sign. Findings are suspicious for acute
cholecystitis. Consider further evaluation with HIDA scan.

2.  Common bile duct is normal in size.

3. Abdominal aorta measures up to 2.7 cm. Recommend follow-up
ultrasound every 5 years. This recommendation follows ACR consensus
guidelines: White Paper of the ACR Incidental Findings Committee II
on Vascular Findings. [HOSPITAL] [id number]; [DATE].

These results were called by telephone at the time of interpretation
on 08/31/2021 at [DATE] to provider Dr. Tiger, who verbally
acknowledged these results.

## 2024-04-11 DIAGNOSIS — M1991 Primary osteoarthritis, unspecified site: Secondary | ICD-10-CM | POA: Diagnosis not present

## 2024-04-11 DIAGNOSIS — M25562 Pain in left knee: Secondary | ICD-10-CM | POA: Diagnosis not present

## 2024-04-11 DIAGNOSIS — L409 Psoriasis, unspecified: Secondary | ICD-10-CM | POA: Diagnosis not present

## 2024-04-11 DIAGNOSIS — M25561 Pain in right knee: Secondary | ICD-10-CM | POA: Diagnosis not present

## 2024-04-23 DIAGNOSIS — K219 Gastro-esophageal reflux disease without esophagitis: Secondary | ICD-10-CM | POA: Diagnosis not present

## 2024-04-23 DIAGNOSIS — R1011 Right upper quadrant pain: Secondary | ICD-10-CM | POA: Diagnosis not present

## 2024-04-23 DIAGNOSIS — R112 Nausea with vomiting, unspecified: Secondary | ICD-10-CM | POA: Diagnosis not present

## 2024-04-29 DIAGNOSIS — Z79890 Hormone replacement therapy (postmenopausal): Secondary | ICD-10-CM | POA: Diagnosis not present

## 2024-04-29 DIAGNOSIS — E291 Testicular hypofunction: Secondary | ICD-10-CM | POA: Diagnosis not present

## 2024-05-01 DIAGNOSIS — M2550 Pain in unspecified joint: Secondary | ICD-10-CM | POA: Diagnosis not present

## 2024-05-01 DIAGNOSIS — Z6828 Body mass index (BMI) 28.0-28.9, adult: Secondary | ICD-10-CM | POA: Diagnosis not present

## 2024-05-01 DIAGNOSIS — E291 Testicular hypofunction: Secondary | ICD-10-CM | POA: Diagnosis not present

## 2024-05-01 DIAGNOSIS — R5383 Other fatigue: Secondary | ICD-10-CM | POA: Diagnosis not present

## 2024-05-15 DIAGNOSIS — F902 Attention-deficit hyperactivity disorder, combined type: Secondary | ICD-10-CM | POA: Diagnosis not present

## 2024-05-15 DIAGNOSIS — Z79899 Other long term (current) drug therapy: Secondary | ICD-10-CM | POA: Diagnosis not present

## 2024-05-16 DIAGNOSIS — Z79899 Other long term (current) drug therapy: Secondary | ICD-10-CM | POA: Diagnosis not present

## 2024-05-16 DIAGNOSIS — F902 Attention-deficit hyperactivity disorder, combined type: Secondary | ICD-10-CM | POA: Diagnosis not present
